# Patient Record
Sex: Male | Born: 1960 | Race: White | Hispanic: No | Marital: Married | Smoking: Former smoker
Health system: Southern US, Community
[De-identification: ages and names within clinical notes are randomized; demographics above are authoritative.]

## PROBLEM LIST (undated history)

## (undated) DIAGNOSIS — G709 Myoneural disorder, unspecified: Secondary | ICD-10-CM

## (undated) DIAGNOSIS — Z87828 Personal history of other (healed) physical injury and trauma: Secondary | ICD-10-CM

## (undated) DIAGNOSIS — M1990 Unspecified osteoarthritis, unspecified site: Secondary | ICD-10-CM

## (undated) DIAGNOSIS — G4730 Sleep apnea, unspecified: Secondary | ICD-10-CM

## (undated) DIAGNOSIS — R0600 Dyspnea, unspecified: Secondary | ICD-10-CM

## (undated) DIAGNOSIS — F32A Depression, unspecified: Secondary | ICD-10-CM

## (undated) DIAGNOSIS — I10 Essential (primary) hypertension: Secondary | ICD-10-CM

## (undated) DIAGNOSIS — F419 Anxiety disorder, unspecified: Secondary | ICD-10-CM

## (undated) DIAGNOSIS — K759 Inflammatory liver disease, unspecified: Secondary | ICD-10-CM

## (undated) HISTORY — PX: CARPAL TUNNEL RELEASE: SHX101

## (undated) HISTORY — PX: VASECTOMY: SHX75

## (undated) HISTORY — PX: HEEL SPUR EXCISION: SHX1733

---

## 2010-08-03 ENCOUNTER — Encounter (HOSPITAL_COMMUNITY)
Admission: RE | Admit: 2010-08-03 | Discharge: 2010-08-03 | Disposition: A | Discharge status: Home / Self Care | Payer: BC Managed Care – PPO | Source: Ambulatory Visit | Attending: Orthopedic Surgery | Admitting: Orthopedic Surgery

## 2010-08-03 ENCOUNTER — Ambulatory Visit (HOSPITAL_COMMUNITY)
Admission: RE | Admit: 2010-08-03 | Discharge: 2010-08-03 | Disposition: A | Discharge status: Home / Self Care | Payer: BC Managed Care – PPO | Source: Ambulatory Visit | Attending: Orthopedic Surgery | Admitting: Orthopedic Surgery

## 2010-08-03 ENCOUNTER — Other Ambulatory Visit (HOSPITAL_COMMUNITY): Payer: Self-pay | Admitting: Orthopedic Surgery

## 2010-08-03 DIAGNOSIS — Z01811 Encounter for preprocedural respiratory examination: Secondary | ICD-10-CM

## 2010-08-03 DIAGNOSIS — M47812 Spondylosis without myelopathy or radiculopathy, cervical region: Secondary | ICD-10-CM | POA: Insufficient documentation

## 2010-08-03 DIAGNOSIS — Z01818 Encounter for other preprocedural examination: Secondary | ICD-10-CM

## 2010-08-03 DIAGNOSIS — M5030 Other cervical disc degeneration, unspecified cervical region: Secondary | ICD-10-CM | POA: Insufficient documentation

## 2010-08-03 DIAGNOSIS — M542 Cervicalgia: Secondary | ICD-10-CM | POA: Insufficient documentation

## 2010-08-03 LAB — CBC
HCT: 41.5 % (ref 39.0–52.0)
Hemoglobin: 14.7 g/dL (ref 13.0–17.0)
MCH: 30.1 pg (ref 26.0–34.0)
MCHC: 35.4 g/dL (ref 30.0–36.0)
MCV: 85 fL (ref 78.0–100.0)
Platelets: 219 10*3/uL (ref 150–400)
RBC: 4.88 MIL/uL (ref 4.22–5.81)
RDW: 12.2 % (ref 11.5–15.5)
WBC: 7.8 10*3/uL (ref 4.0–10.5)

## 2010-08-03 LAB — BASIC METABOLIC PANEL
CO2: 28 mEq/L (ref 19–32)
Chloride: 105 mEq/L (ref 96–112)
GFR calc Af Amer: >60 mL/min (ref >60)
Potassium: 4.8 mEq/L (ref 3.5–5.1)
Sodium: 136 mEq/L (ref 135–145)

## 2010-08-03 LAB — HEPATIC FUNCTION PANEL
AST: 70 U/L — ABNORMAL HIGH (ref 0–37)
Bilirubin, Direct: 0.2 mg/dL (ref 0.0–0.3)
Indirect Bilirubin: 0.8 mg/dL (ref 0.3–0.9)

## 2010-08-03 LAB — SURGICAL PCR SCREEN
MRSA, PCR: NEGATIVE
Staphylococcus aureus: POSITIVE — AB

## 2010-08-09 ENCOUNTER — Inpatient Hospital Stay (HOSPITAL_COMMUNITY): Payer: BC Managed Care – PPO

## 2010-08-09 ENCOUNTER — Ambulatory Visit (HOSPITAL_COMMUNITY)
Admission: RE | Admit: 2010-08-09 | Discharge: 2010-08-10 | Disposition: A | Discharge status: Home / Self Care | Payer: BC Managed Care – PPO | Source: Ambulatory Visit | Attending: Orthopedic Surgery | Admitting: Orthopedic Surgery

## 2010-08-09 DIAGNOSIS — B1920 Unspecified viral hepatitis C without hepatic coma: Secondary | ICD-10-CM | POA: Insufficient documentation

## 2010-08-09 DIAGNOSIS — M47812 Spondylosis without myelopathy or radiculopathy, cervical region: Secondary | ICD-10-CM | POA: Insufficient documentation

## 2010-08-09 DIAGNOSIS — I10 Essential (primary) hypertension: Secondary | ICD-10-CM | POA: Insufficient documentation

## 2010-08-09 LAB — APTT: aPTT: 27 seconds (ref 24–37)

## 2010-08-09 LAB — PROTIME-INR
INR: 0.94 (ref 0.00–1.49)
Prothrombin Time: 12.8 seconds (ref 11.6–15.2)

## 2010-08-09 LAB — HEPATIC FUNCTION PANEL
ALT: 63 U/L — ABNORMAL HIGH (ref 0–53)
Bilirubin, Direct: 0.1 mg/dL (ref 0.0–0.3)
Indirect Bilirubin: 0.5 mg/dL (ref 0.3–0.9)
Total Protein: 7.1 g/dL (ref 6.0–8.3)

## 2010-08-10 ENCOUNTER — Inpatient Hospital Stay (HOSPITAL_COMMUNITY): Payer: BC Managed Care – PPO

## 2010-08-13 NOTE — Op Note (Signed)
Antonio Mcgee, Antonio Mcgee             ACCOUNT NO.:  [id number]  MEDICAL RECORD NO.:  [id number]           PATIENT TYPE:  I  LOCATION:  5015                         FACILITY:  [facility]  PHYSICIAN:  Alvy Beal, MD    DATE OF BIRTH:  1961/03/26  DATE OF PROCEDURE:  08/09/2010 DATE OF DISCHARGE:                              OPERATIVE REPORT   PREOPERATIVE DIAGNOSIS:  Cervical spondylotic radiculopathy.  POSTOPERATIVE DIAGNOSIS:  Cervical spondylotic radiculopathy.  OPERATIVE PROCEDURE:  Anterior cervical diskectomy and fusion, C5-6, C6- 7.  COMPLICATIONS:  None.  ESTIMATED BLOOD LOSS:  Minimal blood loss.  INSTRUMENTATION SYSTEM USED:  Synthes anterior cervical Vector transitional or Vector T-plate with 16-XW screws and Vector size 7 lordotic large Titan intervertebral titanium graft.  CONDITION:  Stable.  HISTORY:  This is a very pleasant 50 year old gentleman with multiple medical history who presents to my office with complaints of severe debilitating neck and radicular arm pain.  Attempts at conservative management have failed to alleviate his pain.  After discussing treatment options, he elected to proceed with surgery.  All appropriate risks, benefits, and alternatives were discussed with the patient and consent was obtained.  OPERATIVE NOTE:  The patient was brought to the operating room, placed supine on the operating table.  After successful induction of general anesthesia and endotracheal intubation, TEDs and SCDs were applied.  The anterior cervical spine was prepped and draped in standard fashion. Rolled towels were placed between the shoulder blades.  The shoulders were taped down and the neck was prepped and draped in standard fashion.  Time-out was done to confirm patient, procedure, and affected extremity, and all other pertinent important data.  Once this was completed, I then made a longitudinal left-sided incision centered over the C6 vertebral body.  A  standard Smith-Robinson approach in the anterior cervical spine was undertaken.  I sharply dissected through the platysma, identified the medial border of the sternocleidomastoid and sharply dissected along this leading edge.  I identified the omohyoid, it was tied and sacrificed.  I then swept the esophagus and trachea medially, protected with appendiceal retractor and visualized and protected the carotid sheath laterally.  I then mobilized the prevertebral fascia to expose the anterior longitudinal ligament.  Once I had exposure, I then placed a needle into the C5-6 disk space, intraoperative fluoro confirmed that I was at the appropriate level.  I then mobilized the longus coli muscles bilaterally using bipolar electrocautery from the midbody of C5 to the midbody of C7.  Self-retaining retractor blades were placed underneath the longus coli muscle.  The endotracheal cuff was deflated. The retractors were expanded and then the cuff reinflated.  I then placed distraction pins into the body of C5 and C6 and distracted the intervertebral space.  I used a 15-blade scalpel to perform an annulotomy and then used a combination of pituitary rongeurs, curettes, and Kerrison rongeurs to remove all of the disk material.  I then used a fine curette to develop plane underneath the posterior longitudinal ligament and exploited this with a 1-mm Kerrison and resected the posterior longitudinal ligament.  I then undercut the uncovertebral joints to  remove the bone spurs.  At this point, I rasped the endplates, so I had bleeding subchondral bone.  I irrigated copiously with normal saline.  At this point, I had an adequate decompression which I confirmed with a nerve hook and I had excellent surfaces for fusion.  I then measured the intervertebral space and with trial devices and then placed a 7 lordotic Titan intervertebral graft, packed with cortical cancellous bone chips mixed with DBX.  I then  repositioned the distraction pins into the body of C7 and C6, distracted the space and using the same technique, I did at C5-6, performed a similar diskectomy.  Again, removing the posterior longitudinal ligament, ensuring with a nerve hook that I had an adequate decompression and that the spurs on the uncovertebral joint were resected.  Again, I put the same size spacer.  At this point, with the decompression diskectomy is complete, I elected to use a Vector T-plate. This would allow some dynamic compression.  The patient was a large male and I thought I could get good fixation with this plate as opposed to the normal Vector.  I applied a 32-mm anterior cervical vector plate and secured it to the vertebral bodies with 16-mm screws.  At each level, I had good bony purchase.  Once all screws were properly torqued down to the plate, I removed the remaining retractors, irrigated copiously, and then checked to ensure the esophagus did not become inadvertently entrapped beneath the plate. Once I had confirmed this, I returned the trachea and esophagus to midline and took a final intraoperative x-ray.  Because of his body habitus, it was difficulty to get a true lateral at C7 level but I was able to oblique and it did appear that the hardware was in good position.  The hardware at the rest of the levels were satisfactory.  I then closed the platysma after irrigating the wound with interrupted 2- 0 Vicryl sutures and 3-0 Monocryl for the skin.  Steri-Strips, dry dressing were applied.  The patient was extubated, transferred to the PACU without incident.  At the end of the case, all needle and sponge counts were correct.     Alvy Beal, MD     DDB/MEDQ  D:  08/09/2010  T:  08/10/2010  Job:  [id number]  cc:   [facility]  Electronically Signed by Venita Lick MD on 08/13/2010 05:21:15 PM

## 2012-06-10 HISTORY — PX: BACK SURGERY: SHX140

## 2012-10-30 IMAGING — CR DG CHEST 2V
2 series · 2 of 2 positions shown · non-contrast
Comparison: None

CLINICAL DATA: Preop ACDF

CHEST - 2 VIEW

[view not recorded (1 of 2)]
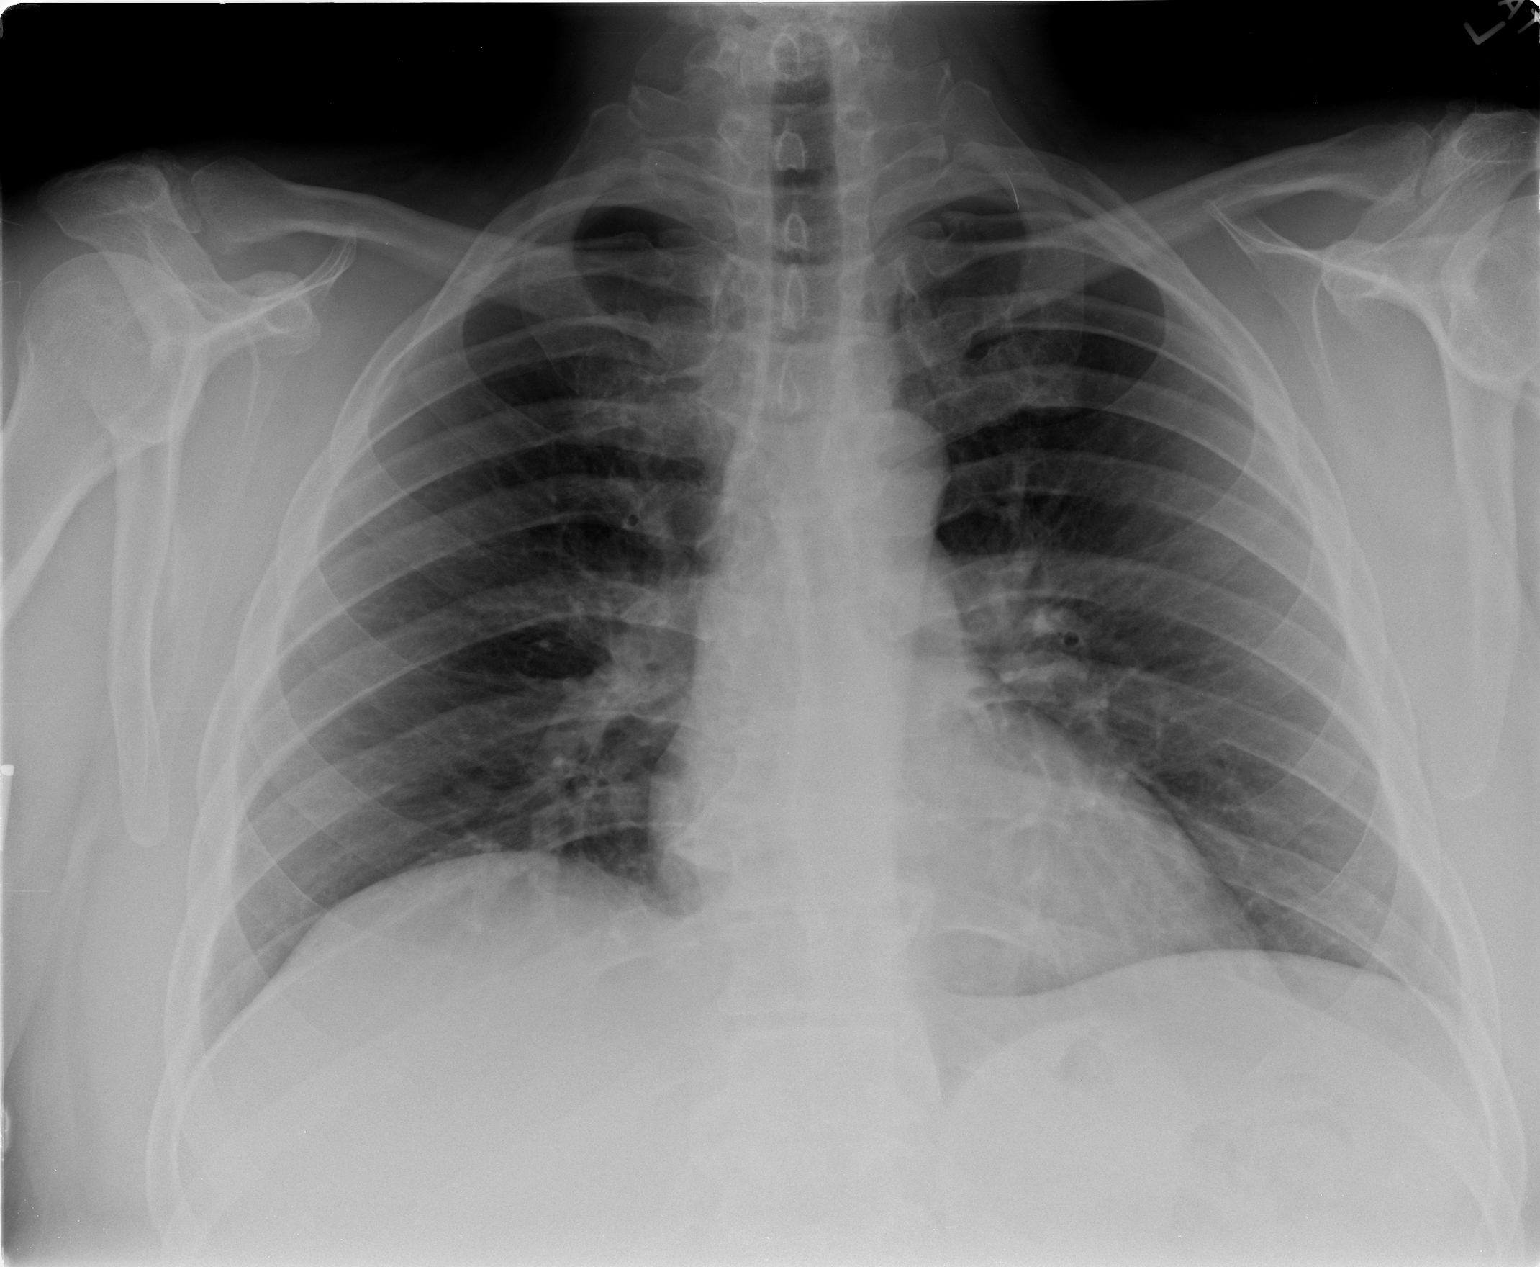

[view not recorded (2 of 2)]
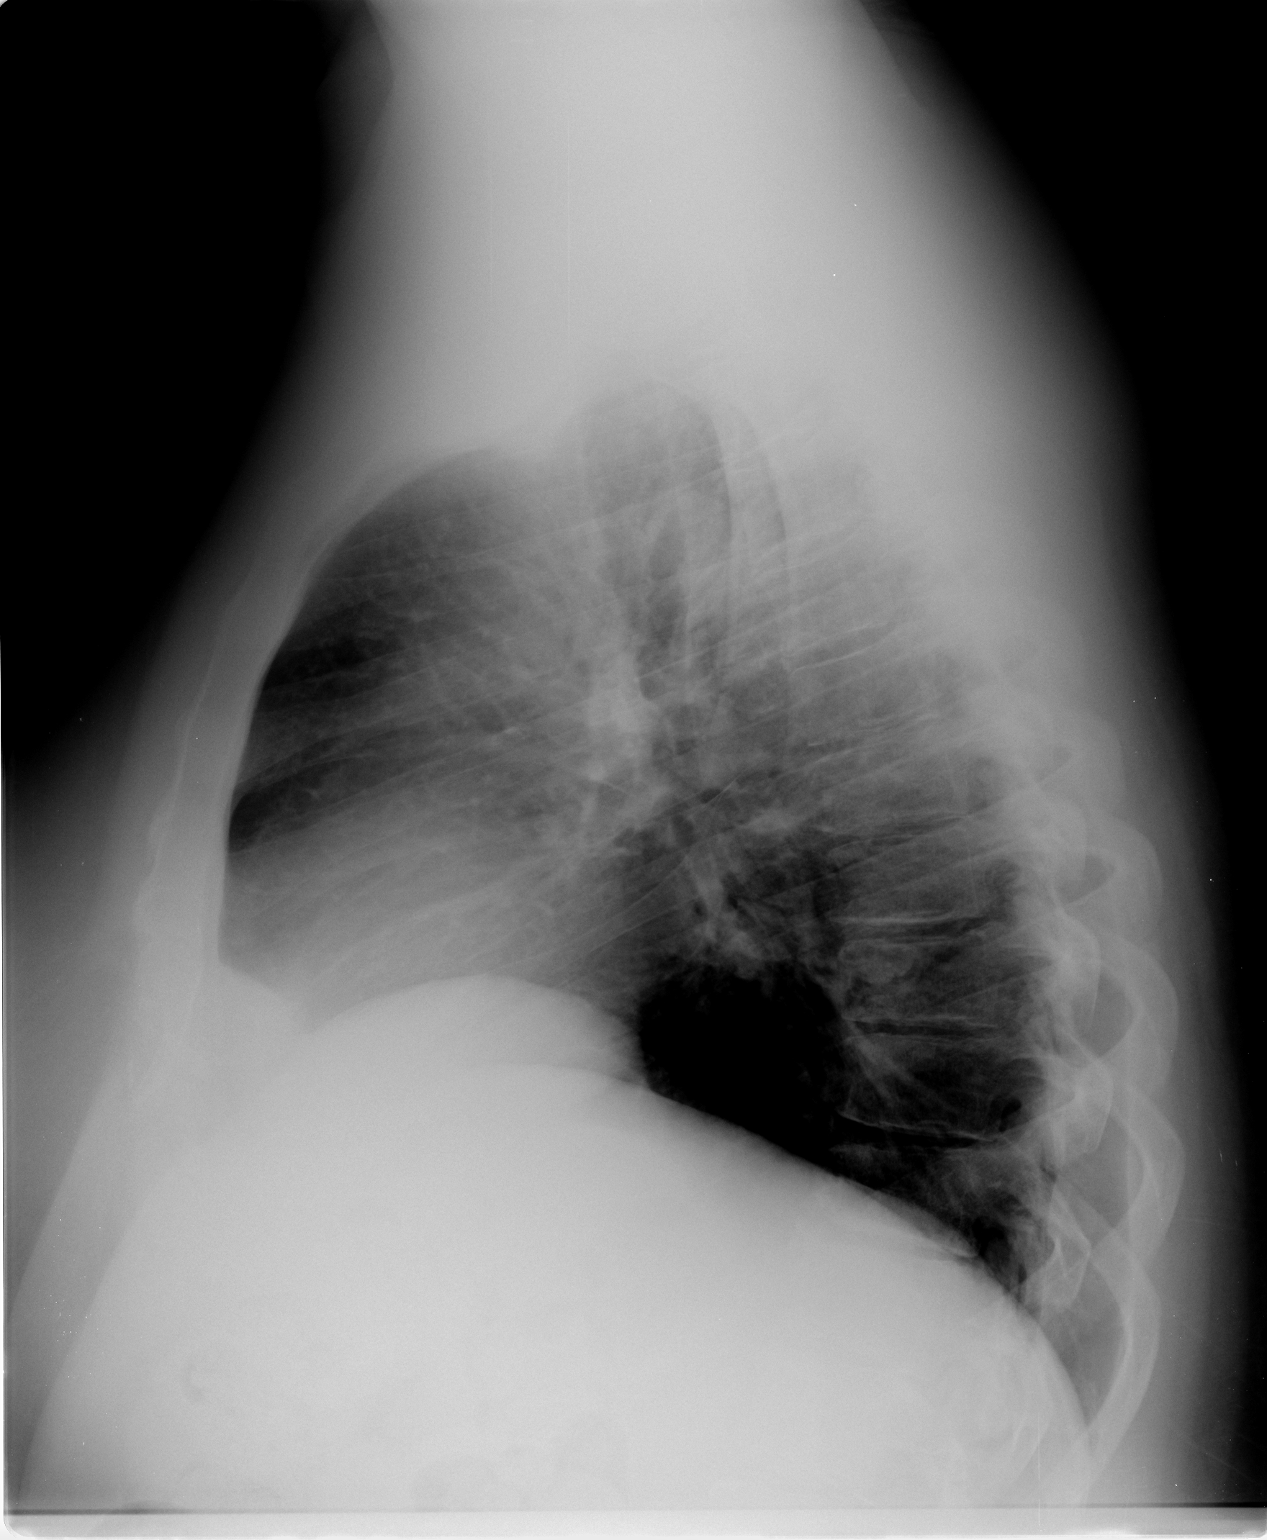

[2 of 2 positions shown; findings below may reference images not displayed]

FINDINGS: Heart size is normal and the vascularity is normal.
Lungs are clear without infiltrate or mass lesion.  Degenerative
change in the AC joint bilaterally.
IMPRESSION: No active cardiopulmonary disease.

## 2014-06-10 HISTORY — PX: NECK SURGERY: SHX720

## 2020-10-06 ENCOUNTER — Ambulatory Visit: Payer: Self-pay | Admitting: Student

## 2020-10-16 ENCOUNTER — Ambulatory Visit: Payer: Self-pay | Admitting: Student
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
## 2020-10-23 ENCOUNTER — Encounter (HOSPITAL_COMMUNITY): Payer: Self-pay

## 2020-10-23 NOTE — Patient Instructions (Signed)
DUE TO COVID-19 ONLY ONE VISITOR IS ALLOWED TO COME WITH YOU AND STAY IN THE WAITING ROOM ONLY DURING PRE OP AND PROCEDURE DAY OF SURGERY. THE 2 VISITORS  MAY VISIT WITH YOU AFTER SURGERY IN YOUR PRIVATE ROOM DURING VISITING HOURS ONLY!  YOU NEED TO HAVE A COVID 19 TEST ON__5/17_____ @__12 :45_____, THIS TEST MUST BE DONE BEFORE SURGERY,  COVID TESTING SITE [address] [place] , IT IS ON THE RIGHT GOING OUT [address] APPROXIMATELY  2 MINUTES PAST [employer] ON THE RIGHT. ONCE YOUR COVID TEST IS COMPLETED,  PLEASE BEGIN THE QUARANTINE INSTRUCTIONS AS OUTLINED IN YOUR HANDOUT.                [id number]   Your procedure is scheduled on: 10/26/20   Report to [facility] Main  Entrance   Report to short stay at 5:15 AM     Call this number if you have problems the morning of surgery [phone]   . BRUSH YOUR TEETH MORNING OF SURGERY AND RINSE YOUR MOUTH OUT, NO CHEWING GUM CANDY OR MINTS.  No food after midnight.    You may have clear liquid until 4:30 AM.    At 4:30 AM drink pre surgery drink  . Nothing by mouth after 4:30 AM.    Take these medicines the morning of surgery with A SIP OF WATER: Topamax, Cymbalta, Atenolol                                 You may not have any metal on your body including              piercings  Do not wear jewelry, lotions, powders or deodorant              Men may shave face and neck.   Do not bring valuables to the hospital. [facility] IS NOT             RESPONSIBLE   FOR VALUABLES.  Contacts, dentures or bridgework may not be worn into surgery.      _____________________________________________________________________             [facility] - Preparing for Surgery Before surgery, you can play an important role.  Because skin is not sterile, your skin needs to be as free of germs as possible.  You can reduce the number of germs on your skin by washing with CHG (chlorahexidine  gluconate) soap before surgery.  CHG is an antiseptic cleaner which kills germs and bonds with the skin to continue killing germs even after washing. Please DO NOT use if you have an allergy to CHG or antibacterial soaps.  If your skin becomes reddened/irritated stop using the CHG and inform your nurse when you arrive at Short Stay.  You may shave your face/neck. Please follow these instructions carefully:  1.  Shower with CHG Soap the night before surgery and the  morning of Surgery.  2.  If you choose to wash your hair, wash your hair first as usual with your  normal  shampoo.  3.  After you shampoo, rinse your hair and body thoroughly to remove the  shampoo.  4.  Use CHG as you would any other liquid soap.  You can apply chg directly  to the skin and wash                       Gently with a scrungie or clean washcloth.  5.  Apply the CHG Soap to your body ONLY FROM THE NECK DOWN.   Do not use on face/ open                           Wound or open sores. Avoid contact with eyes, ears mouth and genitals (private parts).                       Wash face,  Genitals (private parts) with your normal soap.             6.  Wash thoroughly, paying special attention to the area where your surgery  will be performed.  7.  Thoroughly rinse your body with warm water from the neck down.  8.  DO NOT shower/wash with your normal soap after using and rinsing off  the CHG Soap.             9.  Pat yourself dry with a clean towel.            10.  Wear clean pajamas.            11.  Place clean sheets on your bed the night of your first shower and do not  sleep with pets. Day of Surgery : Do not apply any lotions/deodorants the morning of surgery.  Please wear clean clothes to the hospital/surgery center.  FAILURE TO FOLLOW THESE INSTRUCTIONS MAY RESULT IN THE CANCELLATION OF YOUR SURGERY PATIENT SIGNATURE_________________________________  NURSE  SIGNATURE__________________________________  ________________________________________________________________________   Adam Phenix  An incentive spirometer is a tool that can help keep your lungs clear and active. This tool measures how well you are filling your lungs with each breath. Taking long deep breaths may help reverse or decrease the chance of developing breathing (pulmonary) problems (especially infection) following:  A long period of time when you are unable to move or be active. BEFORE THE PROCEDURE   If the spirometer includes an indicator to show your best effort, your nurse or respiratory therapist will set it to a desired goal.  If possible, sit up straight or lean slightly forward. Try not to slouch.  Hold the incentive spirometer in an upright position. INSTRUCTIONS FOR USE  1. Sit on the edge of your bed if possible, or sit up as far as you can in bed or on a chair. 2. Hold the incentive spirometer in an upright position. 3. Breathe out normally. 4. Place the mouthpiece in your mouth and seal your lips tightly around it. 5. Breathe in slowly and as deeply as possible, raising the piston or the ball toward the top of the column. 6. Hold your breath for 3-5 seconds or for as long as possible. Allow the piston or ball to fall to the bottom of the column. 7. Remove the mouthpiece from your mouth and breathe out normally. 8. Rest for a few seconds and repeat Steps 1 through 7 at least 10 times every 1-2 hours when you are awake. Take your time and take a few normal breaths between deep breaths. 9. The spirometer may include an indicator to show your best effort.  Use the indicator as a goal to work toward during each repetition. 10. After each set of 10 deep breaths, practice coughing to be sure your lungs are clear. If you have an incision (the cut made at the time of surgery), support your incision when coughing by placing a pillow or rolled up towels firmly  against it. Once you are able to get out of bed, walk around indoors and cough well. You may stop using the incentive spirometer when instructed by your caregiver.  RISKS AND COMPLICATIONS  Take your time so you do not get dizzy or light-headed.  If you are in pain, you may need to take or ask for pain medication before doing incentive spirometry. It is harder to take a deep breath if you are having pain. AFTER USE  Rest and breathe slowly and easily.  It can be helpful to keep track of a log of your progress. Your caregiver can provide you with a simple table to help with this. If you are using the spirometer at home, follow these instructions: [place] IF:   You are having difficultly using the spirometer.  You have trouble using the spirometer as often as instructed.  Your pain medication is not giving enough relief while using the spirometer.  You develop fever of 100.5 F (38.1 C) or higher. SEEK IMMEDIATE MEDICAL CARE IF:   You cough up bloody sputum that had not been present before.  You develop fever of 102 F (38.9 C) or greater.  You develop worsening pain at or near the incision site. MAKE SURE YOU:   Understand these instructions.  Will watch your condition.  Will get help right away if you are not doing well or get worse. Document Released: 10/07/2006 Document Revised: 08/19/2011 Document Reviewed: 12/08/2006 [facility] Patient Information 2014 [place], [place].   ________________________________________________________________________

## 2020-10-24 ENCOUNTER — Other Ambulatory Visit: Payer: Self-pay

## 2020-10-24 ENCOUNTER — Encounter (HOSPITAL_COMMUNITY): Payer: Self-pay

## 2020-10-24 ENCOUNTER — Encounter (HOSPITAL_COMMUNITY)
Admission: RE | Admit: 2020-10-24 | Discharge: 2020-10-24 | Disposition: A | Discharge status: Home / Self Care | Payer: Medicare HMO | Source: Ambulatory Visit | Attending: Orthopedic Surgery | Admitting: Orthopedic Surgery

## 2020-10-24 ENCOUNTER — Other Ambulatory Visit (HOSPITAL_COMMUNITY)
Admission: RE | Admit: 2020-10-24 | Discharge: 2020-10-24 | Disposition: A | Discharge status: Home / Self Care | Payer: Medicare HMO | Source: Ambulatory Visit | Attending: Orthopedic Surgery | Admitting: Orthopedic Surgery

## 2020-10-24 DIAGNOSIS — M879 Osteonecrosis, unspecified: Secondary | ICD-10-CM | POA: Diagnosis present

## 2020-10-24 DIAGNOSIS — Z01812 Encounter for preprocedural laboratory examination: Secondary | ICD-10-CM | POA: Insufficient documentation

## 2020-10-24 DIAGNOSIS — M85852 Other specified disorders of bone density and structure, left thigh: Secondary | ICD-10-CM | POA: Diagnosis not present

## 2020-10-24 DIAGNOSIS — Z20822 Contact with and (suspected) exposure to covid-19: Secondary | ICD-10-CM | POA: Insufficient documentation

## 2020-10-24 DIAGNOSIS — Z87891 Personal history of nicotine dependence: Secondary | ICD-10-CM | POA: Diagnosis not present

## 2020-10-24 DIAGNOSIS — M1612 Unilateral primary osteoarthritis, left hip: Secondary | ICD-10-CM | POA: Diagnosis not present

## 2020-10-24 DIAGNOSIS — Z6841 Body Mass Index (BMI) 40.0 and over, adult: Secondary | ICD-10-CM | POA: Diagnosis not present

## 2020-10-24 HISTORY — DX: Essential (primary) hypertension: I10

## 2020-10-24 HISTORY — DX: Sleep apnea, unspecified: G47.30

## 2020-10-24 HISTORY — DX: Unspecified osteoarthritis, unspecified site: M19.90

## 2020-10-24 HISTORY — DX: Inflammatory liver disease, unspecified: K75.9

## 2020-10-24 HISTORY — DX: Personal history of other (healed) physical injury and trauma: Z87.828

## 2020-10-24 HISTORY — DX: Anxiety disorder, unspecified: F41.9

## 2020-10-24 HISTORY — DX: Depression, unspecified: F32.A

## 2020-10-24 HISTORY — DX: Dyspnea, unspecified: R06.00

## 2020-10-24 HISTORY — DX: Myoneural disorder, unspecified: G70.9

## 2020-10-24 LAB — COMPREHENSIVE METABOLIC PANEL
ALT: 34 U/L (ref 0–44)
AST: 31 U/L (ref 15–41)
Albumin: 4.6 g/dL (ref 3.5–5.0)
Alkaline Phosphatase: 79 U/L (ref 38–126)
Anion gap: 8 (ref 5–15)
BUN: 13 mg/dL (ref 6–20)
CO2: 26 mmol/L (ref 22–32)
Calcium: 9.2 mg/dL (ref 8.9–10.3)
Chloride: 105 mmol/L (ref 98–111)
Creatinine, Ser: 1.02 mg/dL (ref 0.61–1.24)
GFR, Estimated: >60 mL/min (ref >60)
Glucose, Bld: 115 mg/dL — ABNORMAL HIGH (ref 70–99)
Potassium: 3.8 mmol/L (ref 3.5–5.1)
Sodium: 139 mmol/L (ref 135–145)
Total Bilirubin: 0.9 mg/dL (ref 0.3–1.2)
Total Protein: 7.9 g/dL (ref 6.5–8.1)

## 2020-10-24 LAB — CBC
HCT: 42.9 % (ref 39.0–52.0)
Hemoglobin: 14.8 g/dL (ref 13.0–17.0)
MCH: 30 pg (ref 26.0–34.0)
MCHC: 34.5 g/dL (ref 30.0–36.0)
MCV: 87 fL (ref 80.0–100.0)
Platelets: 231 10*3/uL (ref 150–400)
RBC: 4.93 MIL/uL (ref 4.22–5.81)
RDW: 12.7 % (ref 11.5–15.5)
WBC: 8.3 10*3/uL (ref 4.0–10.5)
nRBC: 0 % (ref 0.0–0.2)

## 2020-10-24 LAB — URINALYSIS, ROUTINE W REFLEX MICROSCOPIC
Bilirubin Urine: NEGATIVE
Glucose, UA: NEGATIVE mg/dL
Hgb urine dipstick: NEGATIVE
Ketones, ur: NEGATIVE mg/dL
Leukocytes,Ua: NEGATIVE
Nitrite: NEGATIVE
Protein, ur: NEGATIVE mg/dL
Specific Gravity, Urine: 1.013 (ref 1.005–1.030)
pH: 5 (ref 5.0–8.0)

## 2020-10-24 LAB — SURGICAL PCR SCREEN
MRSA, PCR: NEGATIVE
Staphylococcus aureus: NEGATIVE

## 2020-10-24 LAB — PROTIME-INR
INR: 1 (ref 0.8–1.2)
Prothrombin Time: 13.3 seconds (ref 11.4–15.2)

## 2020-10-24 LAB — TYPE AND SCREEN
ABO/RH(D): AB POS
Antibody Screen: NEGATIVE

## 2020-10-24 LAB — SARS CORONAVIRUS 2 (TAT 6-24 HRS): SARS Coronavirus 2: NEGATIVE

## 2020-10-24 NOTE — Progress Notes (Signed)
COVID Vaccine Completed:no Date COVID Vaccine completed: COVID vaccine manufacturer: Pfizer    [employer]   PCP - Dr. Cresenciano Genre from [facility] internal med. [place] Cardiologist - no  Chest x-ray - no EKG - 10/24/20-chart,epic Stress Test -no  ECHO - no Cardiac Cath - no Pacemaker/ICD device last checked:NA  Sleep Study - no. Stop Bang score 5. Pt's PCP is aware. Pt refused sleep study CPAP - no  Fasting Blood Sugar - NA Checks Blood Sugar _____ times a day  Blood Thinner Instructions:ASA 81/ Dr. Garry Heater Aspirin Instructions:Stop 5 days prior to DOS/ [place] Last Dose:10/20/20  Anesthesia review:   Patient denies shortness of breath, fever, cough and chest pain at PAT appointment Yes. Pt reports being SOB with most activities like walking, getting dressed and getting around the house. He uses a walker or cane.  Patient verbalized understanding of instructions that were given to them at the PAT appointment. Patient was also instructed that they will need to review over the PAT instructions again at home before surgery.Yes

## 2020-10-25 NOTE — Anesthesia Preprocedure Evaluation (Addendum)
Anesthesia Evaluation  Patient identified by MRN, date of birth, ID band Patient awake    Reviewed: Allergy & Precautions, NPO status , Patient's Chart, lab work & pertinent test results  Airway Mallampati: II  TM Distance: >3 FB Neck ROM: Full    Dental  (+) Dental Advisory Given, Edentulous Upper, Edentulous Lower   Pulmonary shortness of breath, sleep apnea , former smoker,    Pulmonary exam normal breath sounds clear to auscultation       Cardiovascular hypertension, Pt. on medications and Pt. on home beta blockers Normal cardiovascular exam Rhythm:Regular Rate:Normal     Neuro/Psych PSYCHIATRIC DISORDERS Anxiety Depression  Neuromuscular disease    GI/Hepatic negative GI ROS, (+) Hepatitis -, C  Endo/Other  Morbid obesity  Renal/GU negative Renal ROS     Musculoskeletal negative musculoskeletal ROS (+)   Abdominal (+) + obese,   Peds  Hematology negative hematology ROS (+)   Anesthesia Other Findings   Reproductive/Obstetrics                            Anesthesia Physical Anesthesia Plan  ASA: III  Anesthesia Plan: Spinal   Post-op Pain Management:    Induction:   PONV Risk Score and Plan: 2 and Treatment may vary due to age or medical condition, Ondansetron, Dexamethasone and Midazolam  Airway Management Planned: Natural Airway  Additional Equipment: None  Intra-op Plan:   Post-operative Plan:   Informed Consent: I have reviewed the patients History and Physical, chart, labs and discussed the procedure including the risks, benefits and alternatives for the proposed anesthesia with the patient or authorized representative who has indicated his/her understanding and acceptance.     Dental advisory given  Plan Discussed with: CRNA  Anesthesia Plan Comments:        Anesthesia Quick Evaluation

## 2020-10-26 ENCOUNTER — Other Ambulatory Visit: Payer: Self-pay

## 2020-10-26 ENCOUNTER — Ambulatory Visit (HOSPITAL_COMMUNITY): Payer: Medicare HMO

## 2020-10-26 ENCOUNTER — Ambulatory Visit (HOSPITAL_COMMUNITY): Payer: Medicare HMO | Admitting: Anesthesiology

## 2020-10-26 ENCOUNTER — Ambulatory Visit (HOSPITAL_COMMUNITY)
Admission: RE | Admit: 2020-10-26 | Discharge: 2020-10-27 | Disposition: A | Discharge status: Home / Self Care | Payer: Medicare HMO | Attending: Orthopedic Surgery | Admitting: Orthopedic Surgery

## 2020-10-26 ENCOUNTER — Encounter (HOSPITAL_COMMUNITY)
Admission: RE | Disposition: A | Discharge status: Home / Self Care | Payer: Self-pay | Source: Home / Self Care | Attending: Orthopedic Surgery

## 2020-10-26 ENCOUNTER — Encounter (HOSPITAL_COMMUNITY): Payer: Self-pay | Admitting: Orthopedic Surgery

## 2020-10-26 DIAGNOSIS — M1612 Unilateral primary osteoarthritis, left hip: Secondary | ICD-10-CM | POA: Insufficient documentation

## 2020-10-26 DIAGNOSIS — Z87891 Personal history of nicotine dependence: Secondary | ICD-10-CM | POA: Insufficient documentation

## 2020-10-26 DIAGNOSIS — Z419 Encounter for procedure for purposes other than remedying health state, unspecified: Secondary | ICD-10-CM

## 2020-10-26 DIAGNOSIS — M879 Osteonecrosis, unspecified: Secondary | ICD-10-CM | POA: Diagnosis present

## 2020-10-26 DIAGNOSIS — Z6841 Body Mass Index (BMI) 40.0 and over, adult: Secondary | ICD-10-CM | POA: Diagnosis not present

## 2020-10-26 DIAGNOSIS — M87052 Idiopathic aseptic necrosis of left femur: Secondary | ICD-10-CM | POA: Diagnosis present

## 2020-10-26 DIAGNOSIS — Z09 Encounter for follow-up examination after completed treatment for conditions other than malignant neoplasm: Secondary | ICD-10-CM

## 2020-10-26 DIAGNOSIS — M85852 Other specified disorders of bone density and structure, left thigh: Secondary | ICD-10-CM | POA: Insufficient documentation

## 2020-10-26 HISTORY — PX: TOTAL HIP ARTHROPLASTY: SHX124

## 2020-10-26 LAB — ABO/RH: ABO/RH(D): AB POS

## 2020-10-26 SURGERY — ARTHROPLASTY, HIP, TOTAL, ANTERIOR APPROACH
Anesthesia: Spinal | Site: Hip | Laterality: Left

## 2020-10-26 MED ORDER — HYDROMORPHONE HCL 1 MG/ML IJ SOLN: 0.5000 mg | INTRAMUSCULAR | Status: DC | PRN

## 2020-10-26 MED ORDER — DULOXETINE HCL 20 MG PO CPEP
20.0000 mg | ORAL_CAPSULE | Freq: Every day | ORAL | Status: DC
  Administered 2020-10-27: 20 mg via ORAL
  Filled 2020-10-26 (×2): qty 1

## 2020-10-26 MED ORDER — PSYLLIUM 95 % PO PACK
1.0000 | PACK | Freq: Every evening | ORAL | Status: DC
  Filled 2020-10-26: qty 1

## 2020-10-26 MED ORDER — LIDOCAINE 2% (20 MG/ML) 5 ML SYRINGE
INTRAMUSCULAR | Status: AC
  Filled 2020-10-26: qty 5

## 2020-10-26 MED ORDER — DEXAMETHASONE SODIUM PHOSPHATE 10 MG/ML IJ SOLN
INTRAMUSCULAR | Status: AC
  Filled 2020-10-26: qty 1

## 2020-10-26 MED ORDER — DEXAMETHASONE SODIUM PHOSPHATE 10 MG/ML IJ SOLN
INTRAMUSCULAR | Status: DC | PRN
  Administered 2020-10-26: 10 mg via INTRAVENOUS

## 2020-10-26 MED ORDER — TRANEXAMIC ACID-NACL 1000-0.7 MG/100ML-% IV SOLN
1000.0000 mg | Freq: Once | INTRAVENOUS | Status: AC
  Administered 2020-10-26: 1000 mg via INTRAVENOUS
  Filled 2020-10-26: qty 100

## 2020-10-26 MED ORDER — SODIUM CHLORIDE (PF) 0.9 % IJ SOLN
INTRAMUSCULAR | Status: DC | PRN
  Administered 2020-10-26: 30 mL
  Administered 2020-10-26: 50 mL

## 2020-10-26 MED ORDER — ALUM & MAG HYDROXIDE-SIMETH 200-200-20 MG/5ML PO SUSP: 30.0000 mL | ORAL | Status: DC | PRN

## 2020-10-26 MED ORDER — SUGAMMADEX SODIUM 200 MG/2ML IV SOLN
INTRAVENOUS | Status: DC | PRN
  Administered 2020-10-26: 200 mg via INTRAVENOUS

## 2020-10-26 MED ORDER — ATENOLOL 25 MG PO TABS
25.0000 mg | ORAL_TABLET | Freq: Every evening | ORAL | Status: DC
  Administered 2020-10-26: 25 mg via ORAL
  Filled 2020-10-26: qty 1

## 2020-10-26 MED ORDER — POVIDONE-IODINE 10 % EX SWAB: 2.0000 "application " | Freq: Once | CUTANEOUS | Status: DC

## 2020-10-26 MED ORDER — LIDOCAINE 2% (20 MG/ML) 5 ML SYRINGE
INTRAMUSCULAR | Status: DC | PRN
  Administered 2020-10-26: 100 mg via INTRAVENOUS

## 2020-10-26 MED ORDER — ACETAMINOPHEN 10 MG/ML IV SOLN
1000.0000 mg | Freq: Once | INTRAVENOUS | Status: AC
  Administered 2020-10-26: 1000 mg via INTRAVENOUS
  Filled 2020-10-26: qty 100

## 2020-10-26 MED ORDER — DOCUSATE SODIUM 100 MG PO CAPS
100.0000 mg | ORAL_CAPSULE | Freq: Two times a day (BID) | ORAL | Status: DC
  Administered 2020-10-27: 100 mg via ORAL
  Filled 2020-10-26 (×2): qty 1

## 2020-10-26 MED ORDER — HYDROMORPHONE HCL 1 MG/ML IJ SOLN
INTRAMUSCULAR | Status: AC
  Filled 2020-10-26: qty 2

## 2020-10-26 MED ORDER — ATORVASTATIN CALCIUM 20 MG PO TABS
20.0000 mg | ORAL_TABLET | Freq: Every day | ORAL | Status: DC
  Administered 2020-10-26: 20 mg via ORAL
  Filled 2020-10-26 (×2): qty 1

## 2020-10-26 MED ORDER — MIDAZOLAM HCL 5 MG/5ML IJ SOLN
INTRAMUSCULAR | Status: DC | PRN
  Administered 2020-10-26: 2 mg via INTRAVENOUS

## 2020-10-26 MED ORDER — ONDANSETRON HCL 4 MG/2ML IJ SOLN
INTRAMUSCULAR | Status: AC
  Filled 2020-10-26: qty 2

## 2020-10-26 MED ORDER — PHENOL 1.4 % MT LIQD: 1.0000 | OROMUCOSAL | Status: DC | PRN

## 2020-10-26 MED ORDER — ISOPROPYL ALCOHOL 70 % SOLN
Status: DC | PRN
  Administered 2020-10-26: 1 via TOPICAL

## 2020-10-26 MED ORDER — SENNA 8.6 MG PO TABS
1.0000 | ORAL_TABLET | Freq: Two times a day (BID) | ORAL | Status: DC
  Administered 2020-10-27: 8.6 mg via ORAL
  Filled 2020-10-26 (×2): qty 1

## 2020-10-26 MED ORDER — FENTANYL CITRATE (PF) 100 MCG/2ML IJ SOLN
INTRAMUSCULAR | Status: AC
  Filled 2020-10-26: qty 2

## 2020-10-26 MED ORDER — TOPIRAMATE 100 MG PO TABS
100.0000 mg | ORAL_TABLET | Freq: Every evening | ORAL | Status: DC
  Administered 2020-10-26: 100 mg via ORAL
  Filled 2020-10-26: qty 1

## 2020-10-26 MED ORDER — ISOPROPYL ALCOHOL 70 % SOLN
Status: AC
  Filled 2020-10-26: qty 480

## 2020-10-26 MED ORDER — MENTHOL 3 MG MT LOZG: 1.0000 | LOZENGE | OROMUCOSAL | Status: DC | PRN

## 2020-10-26 MED ORDER — CEFAZOLIN SODIUM-DEXTROSE 2-4 GM/100ML-% IV SOLN
2.0000 g | Freq: Four times a day (QID) | INTRAVENOUS | Status: AC
  Administered 2020-10-26 (×2): 2 g via INTRAVENOUS
  Filled 2020-10-26 (×2): qty 100

## 2020-10-26 MED ORDER — CELECOXIB 200 MG PO CAPS
200.0000 mg | ORAL_CAPSULE | Freq: Two times a day (BID) | ORAL | Status: DC
  Administered 2020-10-26 – 2020-10-27 (×2): 200 mg via ORAL
  Filled 2020-10-26 (×2): qty 1

## 2020-10-26 MED ORDER — SODIUM CHLORIDE (PF) 0.9 % IJ SOLN
INTRAMUSCULAR | Status: AC
  Filled 2020-10-26: qty 30

## 2020-10-26 MED ORDER — ROCURONIUM BROMIDE 10 MG/ML (PF) SYRINGE
PREFILLED_SYRINGE | INTRAVENOUS | Status: DC | PRN
  Administered 2020-10-26: 60 mg via INTRAVENOUS
  Administered 2020-10-26: 20 mg via INTRAVENOUS

## 2020-10-26 MED ORDER — BUPIVACAINE-EPINEPHRINE 0.25% -1:200000 IJ SOLN
INTRAMUSCULAR | Status: DC | PRN
  Administered 2020-10-26: 30 mL

## 2020-10-26 MED ORDER — SODIUM CHLORIDE 0.9 % IR SOLN
Status: DC | PRN
Start: 2020-10-26 — End: 2020-10-26
  Administered 2020-10-26 (×2): 1000 mL

## 2020-10-26 MED ORDER — METOCLOPRAMIDE HCL 5 MG/ML IJ SOLN: 5.0000 mg | Freq: Three times a day (TID) | INTRAMUSCULAR | Status: DC | PRN

## 2020-10-26 MED ORDER — PROPOFOL 10 MG/ML IV BOLUS
INTRAVENOUS | Status: DC | PRN
  Administered 2020-10-26: 200 mg via INTRAVENOUS

## 2020-10-26 MED ORDER — ASPIRIN 81 MG PO CHEW
81.0000 mg | CHEWABLE_TABLET | Freq: Two times a day (BID) | ORAL | Status: DC
  Administered 2020-10-26 – 2020-10-27 (×2): 81 mg via ORAL
  Filled 2020-10-26 (×2): qty 1

## 2020-10-26 MED ORDER — HYDROGEN PEROXIDE 3 % EX SOLN
CUTANEOUS | Status: AC
  Filled 2020-10-26: qty 473

## 2020-10-26 MED ORDER — METHOCARBAMOL 500 MG PO TABS
500.0000 mg | ORAL_TABLET | Freq: Four times a day (QID) | ORAL | Status: DC | PRN
  Administered 2020-10-27: 500 mg via ORAL
  Filled 2020-10-26: qty 1

## 2020-10-26 MED ORDER — SUCCINYLCHOLINE CHLORIDE 200 MG/10ML IV SOSY
PREFILLED_SYRINGE | INTRAVENOUS | Status: DC | PRN
  Administered 2020-10-26: 100 mg via INTRAVENOUS

## 2020-10-26 MED ORDER — LACTATED RINGERS IV SOLN: INTRAVENOUS | Status: DC

## 2020-10-26 MED ORDER — OXYCODONE HCL 5 MG PO TABS
ORAL_TABLET | ORAL | Status: AC
  Filled 2020-10-26: qty 1

## 2020-10-26 MED ORDER — TRANEXAMIC ACID-NACL 1000-0.7 MG/100ML-% IV SOLN
1000.0000 mg | INTRAVENOUS | Status: AC
  Administered 2020-10-26: 1000 mg via INTRAVENOUS
  Filled 2020-10-26: qty 100

## 2020-10-26 MED ORDER — METOCLOPRAMIDE HCL 5 MG PO TABS: 5.0000 mg | ORAL_TABLET | Freq: Three times a day (TID) | ORAL | Status: DC | PRN

## 2020-10-26 MED ORDER — WATER FOR IRRIGATION, STERILE IR SOLN: Status: DC | PRN

## 2020-10-26 MED ORDER — ONDANSETRON HCL 4 MG/2ML IJ SOLN
INTRAMUSCULAR | Status: DC | PRN
  Administered 2020-10-26: 4 mg via INTRAVENOUS

## 2020-10-26 MED ORDER — CHLORHEXIDINE GLUCONATE 0.12 % MT SOLN
15.0000 mL | Freq: Once | OROMUCOSAL | Status: AC
  Administered 2020-10-26: 15 mL via OROMUCOSAL

## 2020-10-26 MED ORDER — KETOROLAC TROMETHAMINE 30 MG/ML IJ SOLN
INTRAMUSCULAR | Status: DC | PRN
  Administered 2020-10-26: 30 mg

## 2020-10-26 MED ORDER — METHOCARBAMOL 500 MG IVPB - SIMPLE MED
500.0000 mg | Freq: Four times a day (QID) | INTRAVENOUS | Status: DC | PRN
  Administered 2020-10-26: 500 mg via INTRAVENOUS
  Filled 2020-10-26: qty 50

## 2020-10-26 MED ORDER — KETOROLAC TROMETHAMINE 30 MG/ML IJ SOLN
INTRAMUSCULAR | Status: AC
  Filled 2020-10-26: qty 1

## 2020-10-26 MED ORDER — DIPHENHYDRAMINE HCL 12.5 MG/5ML PO ELIX: 12.5000 mg | ORAL_SOLUTION | ORAL | Status: DC | PRN

## 2020-10-26 MED ORDER — MIDAZOLAM HCL 2 MG/2ML IJ SOLN
INTRAMUSCULAR | Status: AC
  Filled 2020-10-26: qty 2

## 2020-10-26 MED ORDER — ORAL CARE MOUTH RINSE: 15.0000 mL | Freq: Once | OROMUCOSAL | Status: AC

## 2020-10-26 MED ORDER — ALBUMIN HUMAN 5 % IV SOLN: INTRAVENOUS | Status: DC | PRN

## 2020-10-26 MED ORDER — TAPENTADOL HCL 50 MG PO TABS
50.0000 mg | ORAL_TABLET | Freq: Every evening | ORAL | Status: DC
Start: 2020-10-26 — End: 2020-10-27
  Filled 2020-10-26: qty 1

## 2020-10-26 MED ORDER — LINACLOTIDE 145 MCG PO CAPS
145.0000 ug | ORAL_CAPSULE | Freq: Every day | ORAL | Status: DC
  Filled 2020-10-26: qty 1

## 2020-10-26 MED ORDER — IRRISEPT - 450ML BOTTLE WITH 0.05% CHG IN STERILE WATER, USP 99.95% OPTIME
TOPICAL | Status: DC | PRN
  Administered 2020-10-26: 450 mL

## 2020-10-26 MED ORDER — CEFAZOLIN IN SODIUM CHLORIDE 3-0.9 GM/100ML-% IV SOLN
3.0000 g | INTRAVENOUS | Status: AC
  Administered 2020-10-26: 3 g via INTRAVENOUS
  Filled 2020-10-26: qty 100

## 2020-10-26 MED ORDER — MEPERIDINE HCL 50 MG/ML IJ SOLN: 6.2500 mg | INTRAMUSCULAR | Status: DC | PRN

## 2020-10-26 MED ORDER — PROPOFOL 1000 MG/100ML IV EMUL
INTRAVENOUS | Status: AC
  Filled 2020-10-26: qty 100

## 2020-10-26 MED ORDER — POLYETHYLENE GLYCOL 3350 17 G PO PACK: 17.0000 g | PACK | Freq: Every day | ORAL | Status: DC | PRN

## 2020-10-26 MED ORDER — ACETAMINOPHEN 325 MG PO TABS: 325.0000 mg | ORAL_TABLET | Freq: Four times a day (QID) | ORAL | Status: DC | PRN

## 2020-10-26 MED ORDER — SODIUM CHLORIDE 0.9 % IV SOLN: INTRAVENOUS | Status: DC

## 2020-10-26 MED ORDER — POVIDONE-IODINE 10 % EX SWAB
2.0000 "application " | Freq: Once | CUTANEOUS | Status: AC
  Administered 2020-10-26: 2 via TOPICAL

## 2020-10-26 MED ORDER — PROMETHAZINE HCL 25 MG/ML IJ SOLN: 6.2500 mg | INTRAMUSCULAR | Status: DC | PRN

## 2020-10-26 MED ORDER — OXYCODONE HCL 5 MG PO TABS
5.0000 mg | ORAL_TABLET | ORAL | Status: DC | PRN
  Administered 2020-10-26 – 2020-10-27 (×3): 5 mg via ORAL
  Filled 2020-10-26 (×2): qty 1

## 2020-10-26 MED ORDER — PHENYLEPHRINE HCL (PRESSORS) 10 MG/ML IV SOLN
INTRAVENOUS | Status: AC
  Filled 2020-10-26: qty 1

## 2020-10-26 MED ORDER — ONDANSETRON HCL 4 MG PO TABS: 4.0000 mg | ORAL_TABLET | Freq: Four times a day (QID) | ORAL | Status: DC | PRN

## 2020-10-26 MED ORDER — BUPIVACAINE-EPINEPHRINE (PF) 0.25% -1:200000 IJ SOLN
INTRAMUSCULAR | Status: AC
  Filled 2020-10-26: qty 30

## 2020-10-26 MED ORDER — ONDANSETRON HCL 4 MG/2ML IJ SOLN: 4.0000 mg | Freq: Four times a day (QID) | INTRAMUSCULAR | Status: DC | PRN

## 2020-10-26 MED ORDER — HYDROMORPHONE HCL 1 MG/ML IJ SOLN
0.2500 mg | INTRAMUSCULAR | Status: DC | PRN
  Administered 2020-10-26 (×3): 0.5 mg via INTRAVENOUS

## 2020-10-26 MED ORDER — OXYCODONE HCL 5 MG PO TABS
10.0000 mg | ORAL_TABLET | ORAL | Status: DC | PRN
  Administered 2020-10-27: 10 mg via ORAL
  Filled 2020-10-26: qty 2

## 2020-10-26 MED ORDER — FENTANYL CITRATE (PF) 100 MCG/2ML IJ SOLN
INTRAMUSCULAR | Status: DC | PRN
  Administered 2020-10-26: 100 ug via INTRAVENOUS
  Administered 2020-10-26 (×2): 50 ug via INTRAVENOUS

## 2020-10-26 MED ORDER — METHOCARBAMOL 500 MG IVPB - SIMPLE MED
INTRAVENOUS | Status: AC
  Filled 2020-10-26: qty 50

## 2020-10-26 MED ORDER — DEXAMETHASONE SODIUM PHOSPHATE 10 MG/ML IJ SOLN
10.0000 mg | Freq: Once | INTRAMUSCULAR | Status: AC
  Administered 2020-10-27: 10 mg via INTRAVENOUS
  Filled 2020-10-26: qty 1

## 2020-10-26 SURGICAL SUPPLY — 59 items
BAG DECANTER FOR FLEXI CONT (MISCELLANEOUS) IMPLANT
BAG ZIPLOCK 12X15 (MISCELLANEOUS) IMPLANT
BLADE SURG SZ10 CARB STEEL (BLADE) IMPLANT
CHLORAPREP W/TINT 26 (MISCELLANEOUS) ×2 IMPLANT
COVER PERINEAL POST (MISCELLANEOUS) ×2 IMPLANT
COVER SURGICAL LIGHT HANDLE (MISCELLANEOUS) ×2 IMPLANT
COVER WAND RF STERILE (DRAPES) IMPLANT
CUP ACET PINNACLE SECTR 60MM (Hips) ×1 IMPLANT
DECANTER SPIKE VIAL GLASS SM (MISCELLANEOUS) IMPLANT
DERMABOND ADVANCED (GAUZE/BANDAGES/DRESSINGS) ×2
DERMABOND ADVANCED .7 DNX12 (GAUZE/BANDAGES/DRESSINGS) ×2 IMPLANT
DRAPE IMP U-DRAPE 54X76 (DRAPES) ×2 IMPLANT
DRAPE SHEET LG 3/4 BI-LAMINATE (DRAPES) ×6 IMPLANT
DRAPE STERI IOBAN 125X83 (DRAPES) IMPLANT
DRAPE U-SHAPE 47X51 STRL (DRAPES) ×4 IMPLANT
DRSG AQUACEL AG ADV 3.5X10 (GAUZE/BANDAGES/DRESSINGS) ×2 IMPLANT
ELECT REM PT RETURN 15FT ADLT (MISCELLANEOUS) ×2 IMPLANT
GAUZE SPONGE 4X4 12PLY STRL (GAUZE/BANDAGES/DRESSINGS) ×2 IMPLANT
GLOVE SRG 8 PF TXTR STRL LF DI (GLOVE) ×1 IMPLANT
GLOVE SURG ENC MOIS LTX SZ8.5 (GLOVE) ×4 IMPLANT
GLOVE SURG ENC TEXT LTX SZ7.5 (GLOVE) ×4 IMPLANT
GLOVE SURG UNDER POLY LF SZ8 (GLOVE) ×1
GLOVE SURG UNDER POLY LF SZ8.5 (GLOVE) ×2 IMPLANT
GOWN SPEC L3 XXLG W/TWL (GOWN DISPOSABLE) ×2 IMPLANT
GOWN STRL REUS W/TWL XL LVL3 (GOWN DISPOSABLE) ×2 IMPLANT
HANDPIECE INTERPULSE COAX TIP (DISPOSABLE) ×1
HEAD CERAMIC 36 PLUS5 (Hips) ×2 IMPLANT
HOLDER FOLEY CATH W/STRAP (MISCELLANEOUS) ×2 IMPLANT
HOOD PEEL AWAY FLYTE STAYCOOL (MISCELLANEOUS) ×8 IMPLANT
JET LAVAGE IRRISEPT WOUND (IRRIGATION / IRRIGATOR) ×2
KIT TURNOVER KIT A (KITS) ×2 IMPLANT
LAVAGE JET IRRISEPT WOUND (IRRIGATION / IRRIGATOR) ×1 IMPLANT
LINER NEUTRAL 58X36MM PLUS4 ×2 IMPLANT
MANIFOLD NEPTUNE II (INSTRUMENTS) ×2 IMPLANT
MARKER SKIN DUAL TIP RULER LAB (MISCELLANEOUS) ×2 IMPLANT
NDL SAFETY ECLIPSE 18X1.5 (NEEDLE) ×1 IMPLANT
NEEDLE HYPO 18GX1.5 SHARP (NEEDLE) ×1
NEEDLE SPNL 18GX3.5 QUINCKE PK (NEEDLE) ×2 IMPLANT
PACK ANTERIOR HIP CUSTOM (KITS) ×2 IMPLANT
PENCIL SMOKE EVACUATOR (MISCELLANEOUS) ×2 IMPLANT
PINNSECTOR W/GRIP ACE CUP 60MM (Hips) ×2 IMPLANT
SAW OSC TIP CART 19.5X105X1.3 (SAW) ×2 IMPLANT
SEALER BIPOLAR AQUA 6.0 (INSTRUMENTS) ×2 IMPLANT
SET HNDPC FAN SPRY TIP SCT (DISPOSABLE) ×1 IMPLANT
STEM TRI LOC BPS SZ6 W GRIPTON ×1 IMPLANT
SUT ETHIBOND NAB CT1 #1 30IN (SUTURE) ×4 IMPLANT
SUT MNCRL AB 3-0 PS2 18 (SUTURE) ×2 IMPLANT
SUT MNCRL AB 4-0 PS2 18 (SUTURE) ×2 IMPLANT
SUT MON AB 2-0 CT1 36 (SUTURE) ×4 IMPLANT
SUT STRATAFIX PDO 1 14 VIOLET (SUTURE) ×1
SUT STRATFX PDO 1 14 VIOLET (SUTURE) ×1
SUT VIC AB 2-0 CT1 27 (SUTURE) ×1
SUT VIC AB 2-0 CT1 TAPERPNT 27 (SUTURE) ×1 IMPLANT
SUTURE STRATFX PDO 1 14 VIOLET (SUTURE) ×1 IMPLANT
SYR 3ML LL SCALE MARK (SYRINGE) ×4 IMPLANT
TRAY FOLEY MTR SLVR 16FR STAT (SET/KITS/TRAYS/PACK) ×2 IMPLANT
TRI LOC BPS SZ 6 W GRIPTON ×2 IMPLANT
TUBE SUCTION HIGH CAP CLEAR NV (SUCTIONS) ×2 IMPLANT
WATER STERILE IRR 1000ML POUR (IV SOLUTION) ×4 IMPLANT

## 2020-10-26 NOTE — Anesthesia Procedure Notes (Signed)
Procedure Name: Intubation Date/Time: 10/26/2020 8:27 AM Performed by: Gerald Leitz, CRNA Pre-anesthesia Checklist: Patient identified, Patient being monitored, Timeout performed, Emergency Drugs available and Suction available Patient Re-evaluated:Patient Re-evaluated prior to induction Oxygen Delivery Method: Circle system utilized Preoxygenation: Pre-oxygenation with 100% oxygen Induction Type: IV induction Ventilation: Mask ventilation without difficulty Laryngoscope Size: Mac and 3 Grade View: Grade I Tube type: Oral Tube size: 8.0 mm Number of attempts: 1 Placement Confirmation: ETT inserted through vocal cords under direct vision,  positive ETCO2 and breath sounds checked- equal and bilateral Secured at: 23 cm Tube secured with: Tape Dental Injury: Teeth and Oropharynx as per pre-operative assessment

## 2020-10-26 NOTE — Interval H&P Note (Signed)
History and Physical Interval Note:  10/26/2020 8:00 AM  Antonio Mcgee  has presented today for surgery, with the diagnosis of Left hip osteoarthritis.  The various methods of treatment have been discussed with the patient and family. After consideration of risks, benefits and other options for treatment, the patient has consented to  Procedure(s): TOTAL HIP ARTHROPLASTY ANTERIOR APPROACH (Left) as a surgical intervention.  The patient's history has been reviewed, patient examined, no change in status, stable for surgery.  I have reviewed the patient's chart and labs.  Questions were answered to the patient's satisfaction.     [name]

## 2020-10-26 NOTE — Evaluation (Signed)
Physical Therapy Evaluation Patient Details Name: Antonio Mcgee MRN: [id number] DOB: [birth year] Today's Date: 10/26/2020   History of Present Illness  Pt s/p L THR and with hx of back and neck surgery  Clinical Impression  Pt s/p L THR and presents with decreased L LE strength/ROM and post op pain limiting functional mobility.  Pt should progress to dc home with family assist.    Follow Up Recommendations Follow surgeon's recommendation for DC plan and follow-up therapies    Equipment Recommendations  None recommended by PT    Recommendations for Other Services OT consult     Precautions / Restrictions Precautions Precautions: Fall Restrictions Weight Bearing Restrictions: No Other Position/Activity Restrictions: wbat      Mobility  Bed Mobility Overal bed mobility: Needs Assistance Bed Mobility: Supine to Sit     Supine to sit: Min assist;Mod assist     General bed mobility comments: Increased time with cues for sequence and use of R LE to self assist    Transfers Overall transfer level: Needs assistance Equipment used: Rolling walker (2 wheeled) Transfers: Sit to/from Stand Sit to Stand: Min assist;Mod assist         General transfer comment: cues for LE management and use of UEs to self assist.  Physical assist to bring wt up and fwd and to balance in standing  Ambulation/Gait Ambulation/Gait assistance: Min assist Gait Distance (Feet): 50 Feet Assistive device: Rolling walker (2 wheeled) Gait Pattern/deviations: Step-to pattern;Decreased step length - right;Decreased step length - left;Shuffle;Trunk flexed Gait velocity: decr   General Gait Details: cues for sequence, posture and position from [employer]            Wheelchair Mobility    Modified Rankin (Stroke Patients Only)       Balance Overall balance assessment: Needs assistance Sitting-balance support: No upper extremity supported;Feet supported Sitting balance-Leahy Scale:  Good     Standing balance support: Bilateral upper extremity supported Standing balance-Leahy Scale: Poor                               Pertinent Vitals/Pain Pain Assessment: 0-10 Pain Score: 5  Pain Location: L hip/thigh Pain Descriptors / Indicators: Aching;Sore Pain Intervention(s): Limited activity within patient's tolerance;Monitored during session;Premedicated before session;Ice applied    Home Living Family/patient expects to be discharged to:: Private residence Living Arrangements: Spouse/significant other Available Help at Discharge: Family Type of Home: House Home Access: Stairs to enter   Secretary/administrator of Steps: 1 Home Layout: One level Home Equipment: Environmental consultant - 2 wheels;Cane - single point;Bedside commode Additional Comments: Pt states spouse is limited in ability to assist    Prior Function Level of Independence: Independent               Hand Dominance        Extremity/Trunk Assessment   Upper Extremity Assessment Upper Extremity Assessment: Overall WFL for tasks assessed    Lower Extremity Assessment Lower Extremity Assessment: LLE deficits/detail       Communication   Communication: No difficulties  Cognition Arousal/Alertness: Awake/alert Behavior During Therapy: WFL for tasks assessed/performed Overall Cognitive Status: Within Functional Limits for tasks assessed                                        General Comments      Exercises  Total Joint Exercises Ankle Circles/Pumps: AROM;Both;15 reps;Supine   Assessment/Plan    PT Assessment Patient needs continued PT services  PT Problem List Decreased strength;Decreased range of motion;Decreased activity tolerance;Decreased balance;Decreased mobility;Decreased knowledge of use of DME;Pain       PT Treatment Interventions DME instruction;Gait training;Stair training;Functional mobility training;Therapeutic activities;Therapeutic  exercise;Patient/family education    PT Goals (Current goals can be found in the Care Plan section)  Acute Rehab PT Goals Patient Stated Goal: Regain IND PT Goal Formulation: With patient Time For Goal Achievement: 11/02/20 Potential to Achieve Goals: Good    Frequency 7X/week   Barriers to discharge        Co-evaluation               AM-PAC PT "6 Clicks" Mobility  Outcome Measure Help needed turning from your back to your side while in a flat bed without using bedrails?: A Lot Help needed moving from lying on your back to sitting on the side of a flat bed without using bedrails?: A Lot Help needed moving to and from a bed to a chair (including a wheelchair)?: A Little Help needed standing up from a chair using your arms (e.g., wheelchair or bedside chair)?: A Little Help needed to walk in hospital room?: A Little Help needed climbing 3-5 steps with a railing? : A Lot 6 Click Score: 15    End of Session Equipment Utilized During Treatment: Gait belt Activity Tolerance: Patient tolerated treatment well Patient left: in chair;with call bell/phone within reach;with chair alarm set Nurse Communication: Mobility status PT Visit Diagnosis: Unsteadiness on feet (R26.81);Difficulty in walking, not elsewhere classified (R26.2);Pain Pain - Right/Left: Left Pain - part of body: Hip    Time: 1224-8250 PT Time Calculation (min) (ACUTE ONLY): 32 min   Charges:   PT Evaluation $PT Eval Low Complexity: 1 Low PT Treatments $Gait Training: 8-22 mins        Mauro Kaufmann PT Acute Rehabilitation Services Pager [phone] Office [phone]   Antonio Mcgee 10/26/2020, 4:32 PM

## 2020-10-26 NOTE — Op Note (Signed)
OPERATIVE REPORT  SURGEON: Samson Frederic, MD   ASSISTANT: Barrie Dunker, PA-C.  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  PROCEDURE: Left total hip arthroplasty, anterior approach.   IMPLANTS: DePuy Tri Lock stem, size 6, hi offset. DePuy Pinnacle Cup, size 60 mm. DePuy Altrx liner, size 36 by 60 mm, +4  neutral. DePuy Biolox ceramic head ball, size 36 + 5 mm.  ANESTHESIA:  General  ESTIMATED BLOOD LOSS:-900 mL    ANTIBIOTICS: 3g Ancef.  DRAINS: None.  COMPLICATIONS: None.   CONDITION: PACU - hemodynamically stable.   BRIEF CLINICAL NOTE: Antonio Mcgee is a 60 y.o. 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 After failing conservative management, the patient was indicated for total hip arthroplasty. The risks, benefits, and alternatives to the procedure were explained, and the patient elected to proceed.  PROCEDURE IN DETAIL: Surgical site was marked by myself in the pre-op holding area. Once inside the operating room, spinal anesthesia was attempted unsuccessfully, and general anesthesia was obtained. The patient was then positioned on the Hana table.  All bony prominences were well padded.  The hip was prepped and draped in the normal sterile surgical fashion.  A time-out was called verifying side and site of surgery. The patient received IV antibiotics within 60 minutes of beginning the procedure.   Bikini incision was created three finger breadths distal to the ASIS, taking care to stay lateral to the medial border of the ASIS. The direct anterior approach to the hip was performed through the Hueter interval.  Lateral femoral circumflex vessels were treated with the Auqumantys. The anterior capsule was exposed and an inverted T capsulotomy was made. The femoral neck cut was made to the level of the  templated cut.  A corkscrew was placed into the head and the head was removed.  The femoral head was found to be collapsed with eburnated bone. The head was passed to the back table and was measured. Pubofemoral ligament was released off of the calcar, taking care to stay on bone. Superior capsule was released from the greater trochanter, taking care to stay lateral to the posterior border of the femoral neck in order to preserve the short external rotators.   Acetabular exposure was achieved, and the pulvinar and labrum were excised. Sequential reaming of the acetabulum was then performed up to a size 59 mm reamer under direct visulization. A 60 mm cup was then opened and impacted into place at approximately 40 degrees of abduction and 20 degrees of anteversion. The final polyethylene liner was impacted into place and acetabular osteophytes were removed.    I then gained femoral exposure taking care to protect the abductors and greater trochanter.  This was performed using standard external rotation, extension, and adduction.  A cookie cutter was used to enter the femoral canal, and then the femoral canal finder was placed.  Sequential broaching was performed up to a size 6.  Calcar planer was used on the femoral neck remnant.  I placed a hi offset neck and a trial head ball.  The hip was reduced.  Leg lengths and offset were checked fluoroscopically.  The hip was dislocated and trial components were removed.  The final implants were placed, and the hip was reduced.  Fluoroscopy was used to confirm component position and leg lengths.  At 90 degrees of external rotation and  full extension, the hip was stable to an anterior directed force.   The wound was copiously irrigated with Irrisept solution and normal saline using pule lavage.  Marcaine solution was injected into the periarticular soft tissue.  The wound was closed in layers using #1 Stratafix for the fascia, 2-0 Vicryl for the subcutaneous fat, 2-0  Monocryl for the deep dermal layer, 3-0 running Monocryl subcuticular stitch, and Dermabond for the skin.  Once the glue was fully dried, an Aquacell Ag dressing was applied.  The patient was transported to the recovery room in stable condition.  Sponge, needle, and instrument counts were correct at the end of the case x2.  The patient tolerated the procedure well and there were no known complications.  Please note that a surgical assistant was a medical necessity for this procedure to perform it in a safe and expeditious manner. Assistant was necessary to provide appropriate retraction of vital neurovascular structures, to prevent femoral fracture, and to allow for anatomic placement of the prosthesis.

## 2020-10-26 NOTE — Transfer of Care (Signed)
Immediate Anesthesia Transfer of Care Note  Patient: Antonio Mcgee  Procedure(s) Performed: Procedure(s): TOTAL HIP ARTHROPLASTY ANTERIOR APPROACH (Left)  Patient Location: PACU  Anesthesia Type:General  Level of Consciousness: Alert, Awake, Oriented  Airway & Oxygen Therapy: Patient Spontanous Breathing  Post-op Assessment: Report given to RN  Post vital signs: Reviewed and stable  Last Vitals:  Vitals:   10/26/20 0520  BP: (!) 145/103  Pulse: 83  Resp: 18  Temp: 36.8 C  SpO2: 96%    Complications: No apparent anesthesia complications

## 2020-10-26 NOTE — Discharge Instructions (Signed)
 Dr. Krystol Rocco Joint Replacement Specialist [facility] [address] [place], [place] [address] [phone]   TOTAL HIP REPLACEMENT POSTOPERATIVE DIRECTIONS    Hip Rehabilitation, Guidelines Following Surgery   WEIGHT BEARING Weight bearing as tolerated with assist device (walker, cane, etc) as directed, use it as long as suggested by your surgeon or therapist, typically at least 4-6 weeks.  The results of a hip operation are greatly improved after range of motion and muscle strengthening exercises. Follow all safety measures which are given to protect your hip. If any of these exercises cause increased pain or swelling in your joint, decrease the amount until you are comfortable again. Then slowly increase the exercises. Call your caregiver if you have problems or questions.   HOME CARE INSTRUCTIONS  Most of the following instructions are designed to prevent the dislocation of your new hip.  . Remove items at home which could result in a fall. This includes throw rugs or furniture in walking pathways.  . Continue medications as instructed at time of discharge.  You may have some home medications which will be placed on hold until you complete the course of blood thinner medication.  You may start showering once you are discharged home. Do not remove your dressing. . Do not put on socks or shoes without following the instructions of your caregivers.   . Sit on chairs with arms. Use the chair arms to help push yourself up when arising.  . Arrange for the use of a toilet seat elevator so you are not sitting low.   Walk with walker as instructed.  . You may resume a sexual relationship in one month or when given the OK by your caregiver.  . Use walker as long as suggested by your caregivers.  . You may put full weight on your legs and walk as much as is comfortable. . Avoid periods of inactivity such as sitting longer than an hour when not asleep.  This helps prevent blood clots.  . You may return to work once you are cleared by your surgeon.  . Do not drive a car for 6 weeks or until released by your surgeon.  . Do not drive while taking narcotics.  . Wear elastic stockings for two weeks following surgery during the day but you may remove then at night.  . Make sure you keep all of your appointments after your operation with all of your doctors and caregivers. You should call the office at the above phone number and make an appointment for approximately two weeks after the date of your surgery. . Please pick up a stool softener and laxative for home use as long as you are requiring pain medications.  ICE to the affected hip every three hours for 30 minutes at a time and then as needed for pain and swelling. Continue to use ice on the hip for pain and swelling from surgery. You may notice swelling that will progress down to the foot and ankle.  This is normal after surgery.  Elevate the leg when you are not up walking on it.   . It is important for you to complete the blood thinner medication as prescribed by your doctor.  Continue to use the breathing machine which will help keep your temperature down.  It is common for your temperature to cycle up and down following surgery, especially at night when you are not up moving around and exerting yourself.  The breathing machine keeps your   lungs expanded and your temperature down.  RANGE OF MOTION AND STRENGTHENING EXERCISES  These exercises are designed to help you keep full movement of your hip joint. Follow your caregiver's or physical therapist's instructions. Perform all exercises about fifteen times, three times per day or as directed. Exercise both hips, even if you have had only one joint replacement. These exercises can be done on a training (exercise) mat, on the floor, on a table or on a bed. Use whatever works the best and is most comfortable for you. Use music or television while you are  exercising so that the exercises are a pleasant break in your day. This will make your life better with the exercises acting as a break in routine you can look forward to.  . Lying on your back, slowly slide your foot toward your buttocks, raising your knee up off the floor. Then slowly slide your foot back down until your leg is straight again.  . Lying on your back spread your legs as far apart as you can without causing discomfort.  . Lying on your side, raise your upper leg and foot straight up from the floor as far as is comfortable. Slowly lower the leg and repeat.  . Lying on your back, tighten up the muscle in the front of your thigh (quadriceps muscles). You can do this by keeping your leg straight and trying to raise your heel off the floor. This helps strengthen the largest muscle supporting your knee.  . Lying on your back, tighten up the muscles of your buttocks both with the legs straight and with the knee bent at a comfortable angle while keeping your heel on the floor.   SKILLED REHAB INSTRUCTIONS: If the patient is transferred to a skilled rehab facility following release from the hospital, a list of the current medications will be sent to the facility for the patient to continue.  When discharged from the skilled rehab facility, please have the facility set up the patient's Home Health Physical Therapy prior to being released. Also, the skilled facility will be responsible for providing the patient with their medications at time of release from the facility to include their pain medication and their blood thinner medication. If the patient is still at the rehab facility at time of the two week follow up appointment, the skilled rehab facility will also need to assist the patient in arranging follow up appointment in our office and any transportation needs.  POST-OPERATIVE OPIOID TAPER INSTRUCTIONS: . It is important to wean off of your opioid medication as soon as possible. If you do not  need pain medication after your surgery it is ok to stop day one. . Opioids include: o Codeine, Hydrocodone(Norco, Vicodin), Oxycodone(Percocet, oxycontin) and hydromorphone amongst others.  . Long term and even short term use of opiods can cause: o Increased pain response o Dependence o Constipation o Depression o Respiratory depression o And more.  . Withdrawal symptoms can include o Flu like symptoms o Nausea, vomiting o And more . Techniques to manage these symptoms o Hydrate well o Eat regular healthy meals o Stay active o Use relaxation techniques(deep breathing, meditating, yoga) . Do Not substitute Alcohol to help with tapering . If you have been on opioids for less than two weeks and do not have pain than it is ok to stop all together.  . Plan to wean off of opioids o This plan should start within one week post op of your joint replacement. o Maintain   the same interval or time between taking each dose and first decrease the dose.  o Cut the total daily intake of opioids by one tablet each day o Next start to increase the time between doses. o The last dose that should be eliminated is the evening dose.      MAKE SURE YOU:  . Understand these instructions.  . Will watch your condition.  . Will get help right away if you are not doing well or get worse.  Pick up stool softner and laxative for home use following surgery while on pain medications. Do not remove your dressing. The dressing is waterproof--it is OK to take showers. Continue to use ice for pain and swelling after surgery. Do not use any lotions or creams on the incision until instructed by your surgeon. Total Hip Protocol.   

## 2020-10-26 NOTE — Plan of Care (Signed)
  Problem: Education: Goal: Knowledge of General Education information will improve Description Including pain rating scale, medication(s)/side effects and non-pharmacologic comfort measures Outcome: Progressing   

## 2020-10-27 ENCOUNTER — Encounter (HOSPITAL_COMMUNITY): Payer: Self-pay | Admitting: Orthopedic Surgery

## 2020-10-27 DIAGNOSIS — M1612 Unilateral primary osteoarthritis, left hip: Secondary | ICD-10-CM | POA: Diagnosis not present

## 2020-10-27 LAB — CBC
HCT: 27.4 % — ABNORMAL LOW (ref 39.0–52.0)
Hemoglobin: 9.4 g/dL — ABNORMAL LOW (ref 13.0–17.0)
MCH: 30.2 pg (ref 26.0–34.0)
MCHC: 34.3 g/dL (ref 30.0–36.0)
MCV: 88.1 fL (ref 80.0–100.0)
Platelets: 185 10*3/uL (ref 150–400)
RBC: 3.11 MIL/uL — ABNORMAL LOW (ref 4.22–5.81)
RDW: 13 % (ref 11.5–15.5)
WBC: 17.4 10*3/uL — ABNORMAL HIGH (ref 4.0–10.5)
nRBC: 0 % (ref 0.0–0.2)

## 2020-10-27 LAB — BASIC METABOLIC PANEL
Anion gap: 7 (ref 5–15)
BUN: 21 mg/dL — ABNORMAL HIGH (ref 6–20)
CO2: 24 mmol/L (ref 22–32)
Calcium: 8.3 mg/dL — ABNORMAL LOW (ref 8.9–10.3)
Chloride: 107 mmol/L (ref 98–111)
Creatinine, Ser: 0.95 mg/dL (ref 0.61–1.24)
GFR, Estimated: >60 mL/min (ref >60)
Glucose, Bld: 141 mg/dL — ABNORMAL HIGH (ref 70–99)
Potassium: 3.5 mmol/L (ref 3.5–5.1)
Sodium: 138 mmol/L (ref 135–145)

## 2020-10-27 MED ORDER — ONDANSETRON HCL 4 MG PO TABS: 4.0000 mg | ORAL_TABLET | Freq: Four times a day (QID) | ORAL | 0 refills | Status: AC | PRN

## 2020-10-27 MED ORDER — DOCUSATE SODIUM 100 MG PO CAPS: 100.0000 mg | ORAL_CAPSULE | Freq: Two times a day (BID) | ORAL | 1 refills | Status: AC

## 2020-10-27 MED ORDER — ASPIRIN 81 MG PO CHEW: 81.0000 mg | CHEWABLE_TABLET | Freq: Two times a day (BID) | ORAL | 0 refills | Status: AC

## 2020-10-27 MED ORDER — OXYCODONE HCL 5 MG PO TABS: 5.0000 mg | ORAL_TABLET | ORAL | 0 refills | Status: AC | PRN

## 2020-10-27 MED ORDER — SENNA 8.6 MG PO TABS: 2.0000 | ORAL_TABLET | Freq: Every day | ORAL | 1 refills | Status: AC

## 2020-10-27 NOTE — Plan of Care (Signed)
Plan of care discussed with pt.

## 2020-10-27 NOTE — Progress Notes (Signed)
Physical Therapy Treatment Patient Details Name: Antonio Mcgee MRN: [id number] DOB: 1960/10/06 Today's Date: 10/27/2020    History of Present Illness Pt s/p L THR and with hx of back and neck surgery    PT Comments    POD # 1 am session Pt OOB in recliner.  Assisted with amb in hallway, practiced one step he has to enter his home.  Then returned to room to perform some TE's following HEP handout.  Instructed on proper tech, freq as well as use of ICE.   Addressed all mobility questions, discussed appropriate activity, educated on use of ICE.  Pt ready for D/C to home.   Follow Up Recommendations  Follow surgeon's recommendation for DC plan and follow-up therapies (HEP)     Equipment Recommendations  None recommended by PT    Recommendations for Other Services       Precautions / Restrictions Precautions Precautions: Fall Restrictions Weight Bearing Restrictions: No LLE Weight Bearing: Weight bearing as tolerated    Mobility  Bed Mobility               General bed mobility comments: OOB in recliner    Transfers Overall transfer level: Needs assistance Equipment used: Rolling walker (2 wheeled) Transfers: Stand Pivot Transfers Sit to Stand: Supervision Stand pivot transfers: Supervision;Min guard       General transfer comment: 25% VC's on safety with turns  Ambulation/Gait Ambulation/Gait assistance: Supervision Gait Distance (Feet): 85 Feet Assistive device: Rolling walker (2 wheeled) Gait Pattern/deviations: Step-to pattern;Decreased step length - right;Decreased step length - left;Shuffle;Trunk flexed Gait velocity: decr   General Gait Details: 25% VC's on safety with turns and proper walker to self distance   Stairs Stairs: Yes Stairs assistance: Supervision;Min guard Stair Management: No rails;Step to pattern;Forwards;With walker Number of Stairs: 1 General stair comments: practiced one step pt has to enter home.   Wheelchair Mobility     Modified Rankin (Stroke Patients Only)       Balance                                            Cognition Arousal/Alertness: Awake/alert Behavior During Therapy: WFL for tasks assessed/performed Overall Cognitive Status: Within Functional Limits for tasks assessed                                        Exercises   Total Hip Replacement TE's following HEP Handout 10 reps ankle pumps 05 reps knee presses 05 reps heel slides 05 reps SAQ's 05 reps ABD Instructed how to use a belt loop to assist  Followed by ICE     General Comments        Pertinent Vitals/Pain Pain Assessment: 0-10 Pain Score: 5  Pain Location: L hip/thigh Pain Descriptors / Indicators: Aching;Sore;Discomfort;Operative site guarding Pain Intervention(s): Monitored during session;Premedicated before session;Repositioned;Ice applied    Home Living                      Prior Function            PT Goals (current goals can now be found in the care plan section) Progress towards PT goals: Progressing toward goals    Frequency    7X/week      PT Plan Current plan  remains appropriate    Co-evaluation              AM-PAC PT "6 Clicks" Mobility   Outcome Measure  Help needed turning from your back to your side while in a flat bed without using bedrails?: A Little Help needed moving from lying on your back to sitting on the side of a flat bed without using bedrails?: A Little Help needed moving to and from a bed to a chair (including a wheelchair)?: A Little Help needed standing up from a chair using your arms (e.g., wheelchair or bedside chair)?: A Little Help needed to walk in hospital room?: A Little Help needed climbing 3-5 steps with a railing? : A Little 6 Click Score: 18    End of Session Equipment Utilized During Treatment: Gait belt Activity Tolerance: Patient tolerated treatment well Patient left: in chair;with call bell/phone  within reach;with chair alarm set Nurse Communication: Mobility status (pt ready for D/C to home) PT Visit Diagnosis: Unsteadiness on feet (R26.81);Difficulty in walking, not elsewhere classified (R26.2);Pain Pain - Right/Left: Left Pain - part of body: Hip     Time: 9935-7017 PT Time Calculation (min) (ACUTE ONLY): 29 min  Charges:  $Gait Training: 8-22 mins $Therapeutic Exercise: 8-22 mins                     Felecia Shelling  PTA Acute  Rehabilitation Services Pager      [phone] Office      [phone]

## 2020-10-27 NOTE — TOC Transition Note (Signed)
Transition of Care [facility]) - CM/SW Discharge Note   Patient Details  Name: Antonio Mcgee MRN: [id number] Date of Birth: [birth year]  Transition of Care [facility]) CM/SW Contact:  Lennart Pall, LCSW Phone Number: 10/27/2020, 9:34 AM   Clinical Narrative:    Met with pt and confirmed that he has all needed DME at home.  Plan HEP.  No TOC needs.   Final next level of care: Home/Self Care Barriers to Discharge: No Barriers Identified   Patient Goals and CMS Choice Patient states their goals for this hospitalization and ongoing recovery are:: return home      Discharge Placement                       Discharge Plan and Services                DME Arranged: N/A DME Agency: NA                  Social Determinants of Health (SDOH) Interventions     Readmission Risk Interventions No flowsheet data found.

## 2020-10-27 NOTE — Plan of Care (Signed)
Plan of care reviewed and discussed with the patient. 

## 2020-10-27 NOTE — Progress Notes (Signed)
    Subjective:  Patient reports pain as mild to moderate.  Denies N/V/CP/SOB. No c/o. Had muscle spasms last night  Objective:   VITALS:   Vitals:   10/26/20 1440 10/26/20 2057 10/27/20 0125 10/27/20 0546  BP: 115/76 139/88 114/71 (!) 151/78  Pulse: 86 87 88 82  Resp: $Remo'18 18 17 18  'gGyPp$ Temp:  99 F (37.2 C) 98.9 F (37.2 C) 99.5 F (37.5 C)  TempSrc:  Oral Oral Oral  SpO2: 99% 98% 100% 97%    NAD Neurovascular intact Sensation intact distally Intact pulses distally Dorsiflexion/Plantar flexion intact Incision: dressing C/D/I Compartment soft   Lab Results  Component Value Date   WBC 17.4 (H) 10/27/2020   HGB 9.4 (L) 10/27/2020   HCT 27.4 (L) 10/27/2020   MCV 88.1 10/27/2020   PLT 185 10/27/2020   BMET    Component Value Date/Time   NA 138 10/27/2020 0315   K 3.5 10/27/2020 0315   CL 107 10/27/2020 0315   CO2 24 10/27/2020 0315   GLUCOSE 141 (H) 10/27/2020 0315   BUN 21 (H) 10/27/2020 0315   CREATININE 0.95 10/27/2020 0315   CALCIUM 8.3 (L) 10/27/2020 0315   GFRNONAA >60 10/27/2020 0315   GFRAA  08/03/2010 1141    >60        The eGFR has been calculated using the MDRD equation. This calculation has not been validated in all clinical situations. eGFR's persistently <60 mL/min signify possible Chronic Kidney Disease.     Assessment/Plan: 1 Day Post-Op   Principal Problem:   Osteoarthritis of left hip Active Problems:   Avascular necrosis of hip, left (HCC)   WBAT with walker DVT ppx: Aspirin, SCDs, TEDS PO pain control PT/OT Dispo: D/C home with HEP after clears therapy   Hilton Cork Leilanni Halvorson 10/27/2020, 7:54 AM   Rod Can, MD [phone] [place] is now [facility]  [place] [address], [place], [place], [place] [address] Phone: [phone] [email or web address] Facebook  [employer]
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
## 2020-11-07 NOTE — Anesthesia Postprocedure Evaluation (Signed)
Anesthesia Post Note  Patient: Antonio Mcgee  Procedure(s) Performed: TOTAL HIP ARTHROPLASTY ANTERIOR APPROACH (Left Hip)     Patient location during evaluation: PACU Anesthesia Type: Spinal Level of consciousness: oriented and awake and alert Pain management: pain level controlled Vital Signs Assessment: post-procedure vital signs reviewed and stable Respiratory status: spontaneous breathing, respiratory function stable and patient connected to nasal cannula oxygen Cardiovascular status: blood pressure returned to baseline and stable Postop Assessment: no headache, no backache and no apparent nausea or vomiting Anesthetic complications: no   No complications documented.  Last Vitals:  Vitals:   10/27/20 0546 10/27/20 1007  BP: (!) 151/78 117/71  Pulse: 82 71  Resp: 18 16  Temp: 37.5 C 36.5 C  SpO2: 97% 99%    Last Pain:  Vitals:   10/27/20 1007  TempSrc: Oral  PainSc:                  Lewie Loron

## 2023-01-23 IMAGING — RF DG HIP (WITH PELVIS) OPERATIVE*L*
1 series · 4 of 4 positions shown · non-contrast
Comparison: None

CLINICAL DATA: Total left hip arthroplasty.

EXAM:
OPERATIVE left HIP (WITH PELVIS IF PERFORMED) 4 VIEWS
TECHNIQUE: Fluoroscopic spot image(s) were submitted for interpretation
post-operatively.

[Series 1: unknown protocol · 0.20mm/px · 4 of 4 slices shown]
[im 1/4]
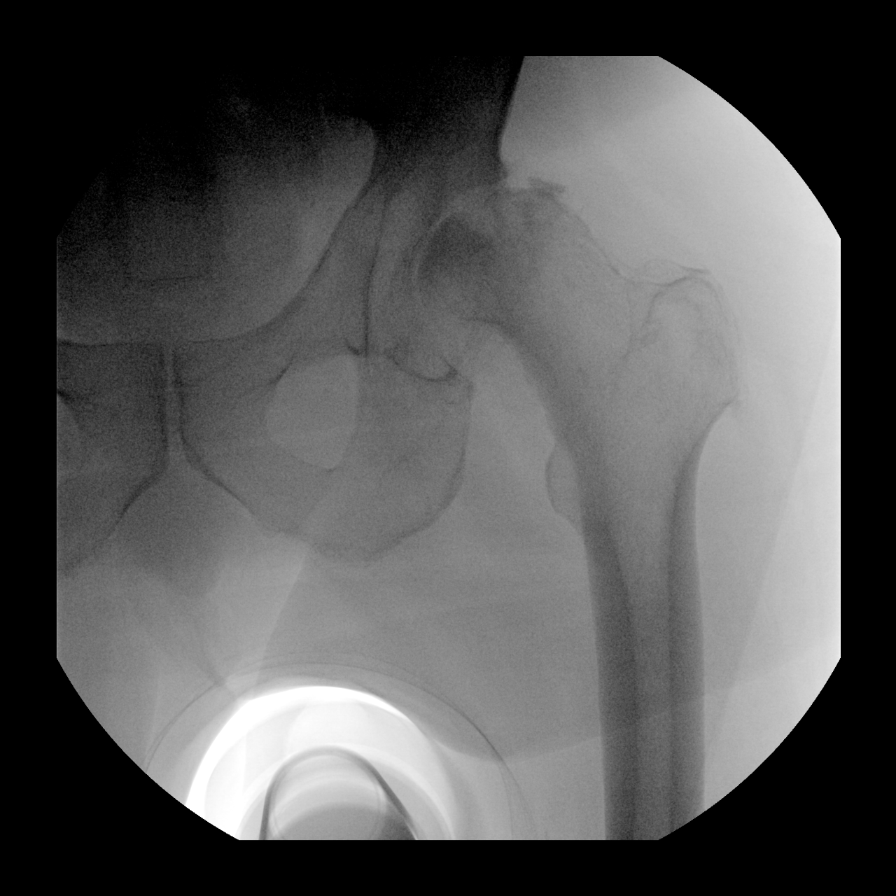
[im 2/4]
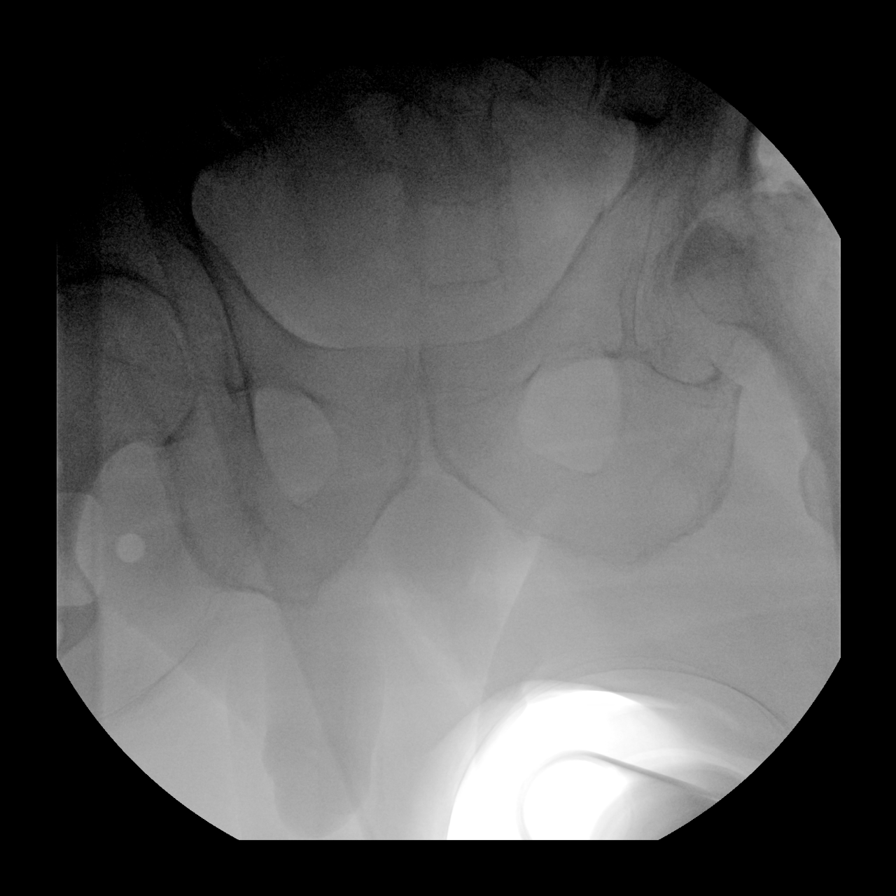
[im 3/4]
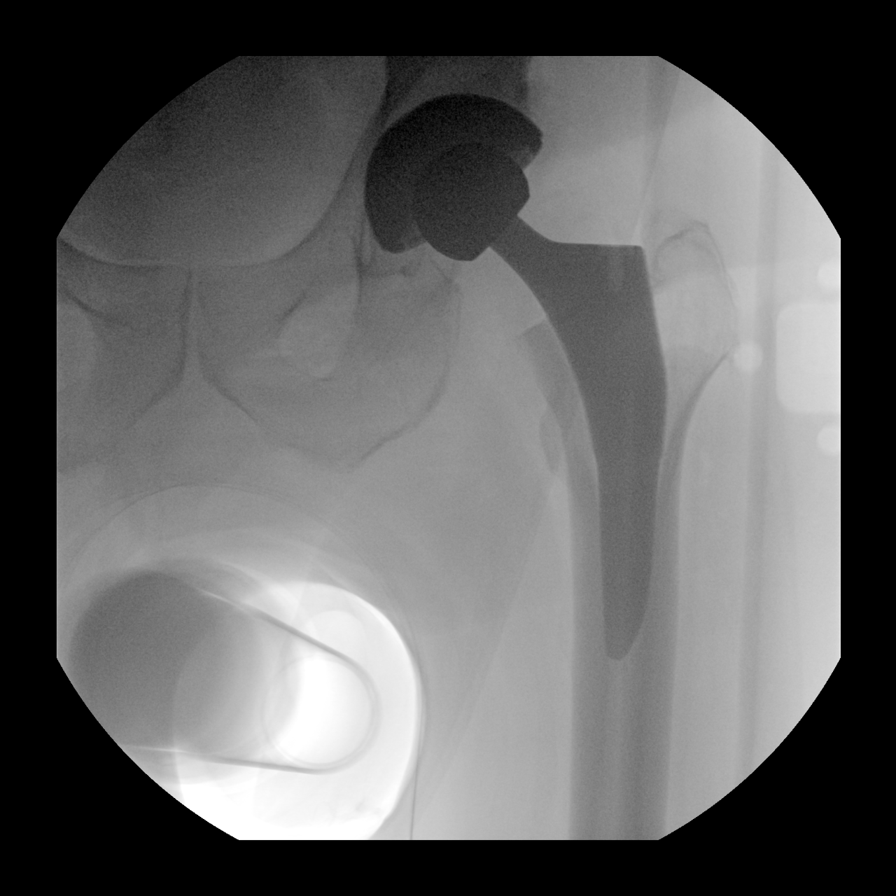
[im 4/4]
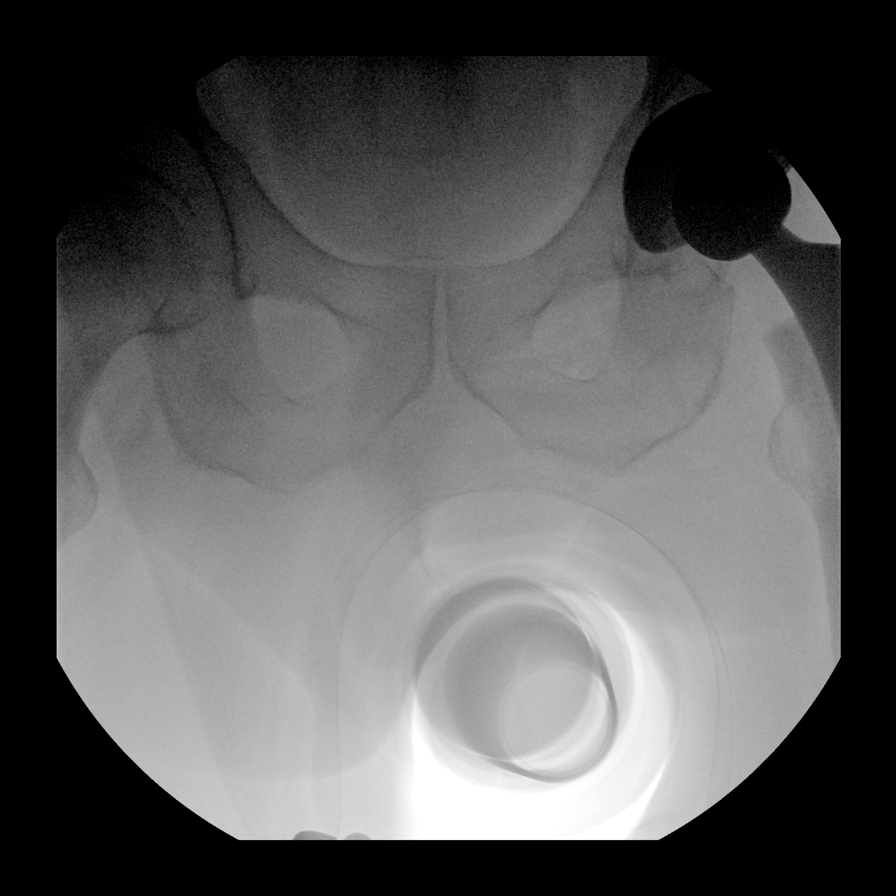

[4 of 4 positions shown; findings below may reference images not displayed]

FINDINGS: Well seated components of a total left hip arthroplasty. No
complicating features are identified.
IMPRESSION: Well seated components of a total left hip arthroplasty.

## 2023-04-07 ENCOUNTER — Other Ambulatory Visit (HOSPITAL_COMMUNITY): Payer: Self-pay | Admitting: Orthopedic Surgery

## 2023-04-07 DIAGNOSIS — M25551 Pain in right hip: Secondary | ICD-10-CM

## 2023-04-16 ENCOUNTER — Ambulatory Visit (HOSPITAL_COMMUNITY)
Admission: RE | Admit: 2023-04-16 | Discharge: 2023-04-16 | Disposition: A | Discharge status: Home / Self Care | Payer: Medicare HMO | Source: Ambulatory Visit | Attending: Orthopedic Surgery | Admitting: Orthopedic Surgery

## 2023-04-16 DIAGNOSIS — M25551 Pain in right hip: Secondary | ICD-10-CM | POA: Insufficient documentation
